# Patient Record
Sex: Female | Born: 1965 | Hispanic: No | Marital: Married | Smoking: Former smoker
Health system: Southern US, Community
[De-identification: ages and names within clinical notes are randomized; demographics above are authoritative.]

## PROBLEM LIST (undated history)

## (undated) DIAGNOSIS — I10 Essential (primary) hypertension: Secondary | ICD-10-CM

## (undated) DIAGNOSIS — F32A Depression, unspecified: Secondary | ICD-10-CM

## (undated) DIAGNOSIS — F419 Anxiety disorder, unspecified: Secondary | ICD-10-CM

## (undated) DIAGNOSIS — E119 Type 2 diabetes mellitus without complications: Secondary | ICD-10-CM

## (undated) HISTORY — DX: Type 2 diabetes mellitus without complications: E11.9

## (undated) HISTORY — DX: Anxiety disorder, unspecified: F41.9

## (undated) HISTORY — DX: Depression, unspecified: F32.A

## (undated) HISTORY — PX: TUBAL LIGATION: SHX77

---

## 2000-12-28 ENCOUNTER — Other Ambulatory Visit: Admission: RE | Admit: 2000-12-28 | Discharge: 2000-12-28 | Payer: Self-pay | Admitting: *Deleted

## 2001-09-10 ENCOUNTER — Emergency Department (HOSPITAL_COMMUNITY): Admission: EM | Admit: 2001-09-10 | Discharge: 2001-09-10 | Payer: Self-pay | Admitting: Emergency Medicine

## 2002-10-11 ENCOUNTER — Other Ambulatory Visit: Admission: RE | Admit: 2002-10-11 | Discharge: 2002-10-11 | Payer: Self-pay | Admitting: *Deleted

## 2003-07-25 ENCOUNTER — Emergency Department (HOSPITAL_COMMUNITY): Admission: EM | Admit: 2003-07-25 | Discharge: 2003-07-25 | Payer: Self-pay

## 2003-07-28 ENCOUNTER — Encounter: Payer: Self-pay | Admitting: *Deleted

## 2003-07-28 ENCOUNTER — Ambulatory Visit (HOSPITAL_COMMUNITY): Admission: RE | Admit: 2003-07-28 | Discharge: 2003-07-28 | Payer: Self-pay | Admitting: *Deleted

## 2003-12-14 ENCOUNTER — Encounter: Admission: RE | Admit: 2003-12-14 | Discharge: 2003-12-14 | Payer: Self-pay | Admitting: Gastroenterology

## 2004-10-30 ENCOUNTER — Other Ambulatory Visit: Admission: RE | Admit: 2004-10-30 | Discharge: 2004-10-30 | Payer: Self-pay | Admitting: Family Medicine

## 2005-08-20 ENCOUNTER — Encounter: Admission: RE | Admit: 2005-08-20 | Discharge: 2005-08-20 | Payer: Self-pay | Admitting: Family Medicine

## 2006-09-09 ENCOUNTER — Other Ambulatory Visit: Admission: RE | Admit: 2006-09-09 | Discharge: 2006-09-09 | Payer: Self-pay | Admitting: Family Medicine

## 2007-06-09 ENCOUNTER — Ambulatory Visit (HOSPITAL_COMMUNITY): Admission: RE | Admit: 2007-06-09 | Discharge: 2007-06-09 | Payer: Self-pay | Admitting: Family Medicine

## 2007-09-15 ENCOUNTER — Other Ambulatory Visit: Admission: RE | Admit: 2007-09-15 | Discharge: 2007-09-15 | Payer: Self-pay | Admitting: Family Medicine

## 2008-02-09 ENCOUNTER — Encounter: Admission: RE | Admit: 2008-02-09 | Discharge: 2008-02-09 | Payer: Self-pay | Admitting: Family Medicine

## 2008-02-17 ENCOUNTER — Encounter: Admission: RE | Admit: 2008-02-17 | Discharge: 2008-02-17 | Payer: Self-pay | Admitting: Family Medicine

## 2009-05-03 ENCOUNTER — Encounter: Admission: RE | Admit: 2009-05-03 | Discharge: 2009-05-03 | Payer: Self-pay | Admitting: Family Medicine

## 2009-10-24 ENCOUNTER — Other Ambulatory Visit: Admission: RE | Admit: 2009-10-24 | Discharge: 2009-10-24 | Payer: Self-pay | Admitting: Family Medicine

## 2010-10-23 ENCOUNTER — Encounter: Admission: RE | Admit: 2010-10-23 | Discharge: 2010-10-23 | Payer: Self-pay | Admitting: Family Medicine

## 2010-12-13 ENCOUNTER — Encounter
Admission: RE | Admit: 2010-12-13 | Discharge: 2010-12-13 | Payer: Self-pay | Source: Home / Self Care | Attending: Family Medicine | Admitting: Family Medicine

## 2011-01-05 ENCOUNTER — Encounter: Payer: Self-pay | Admitting: Family Medicine

## 2011-01-15 ENCOUNTER — Encounter: Payer: Self-pay | Admitting: Family Medicine

## 2011-06-25 IMAGING — US US ABDOMEN COMPLETE
1 series · 14 of 25 positions shown · non-contrast
Comparison: 06/09/2007

CLINICAL DATA: Nausea and abdominal pain.

COMPLETE ABDOMINAL ULTRASOUND

[Series 1: us abdomen complete · 0.35mm/px · 14 of 58 slices shown]
[im 1/58]
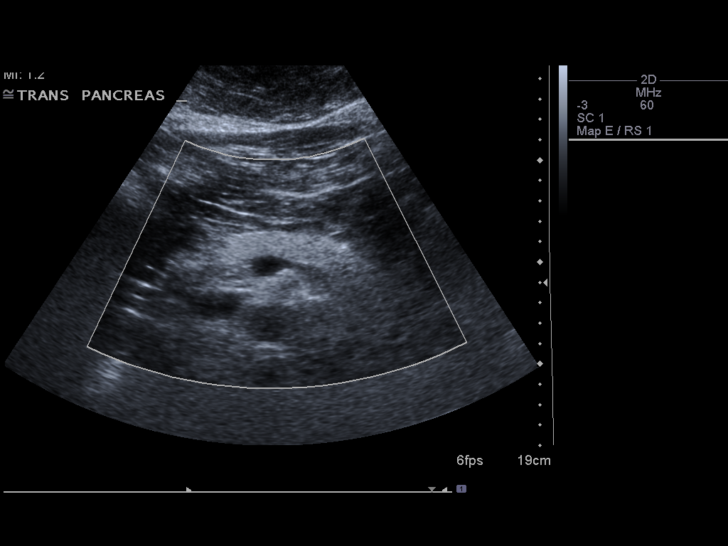
[im 5/58]
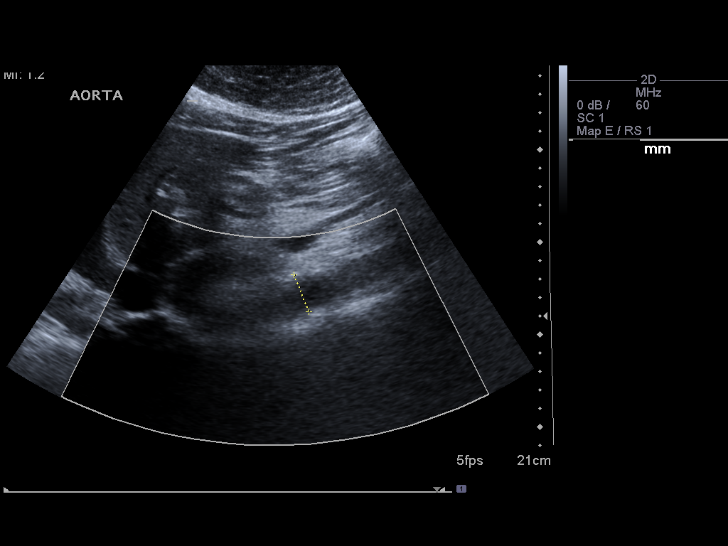
[im 10/58]
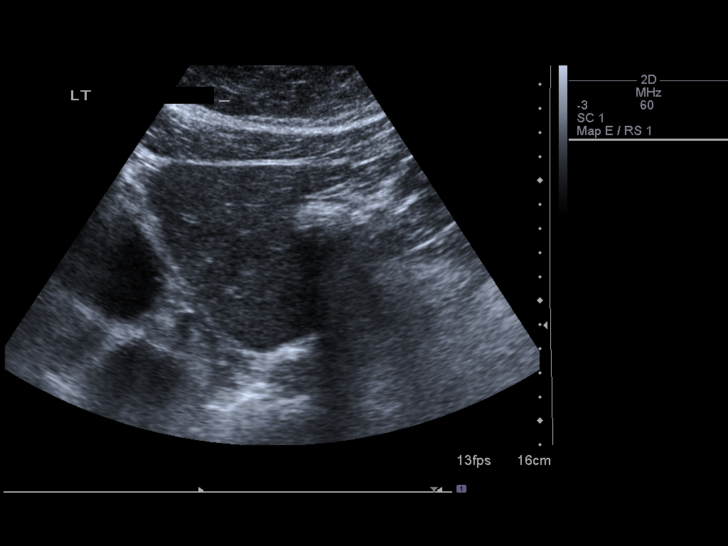
[im 15/58]
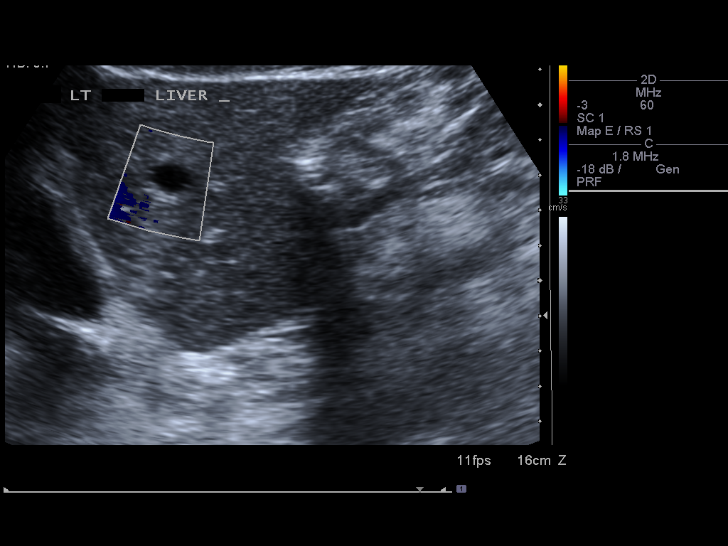
[im 20/58]
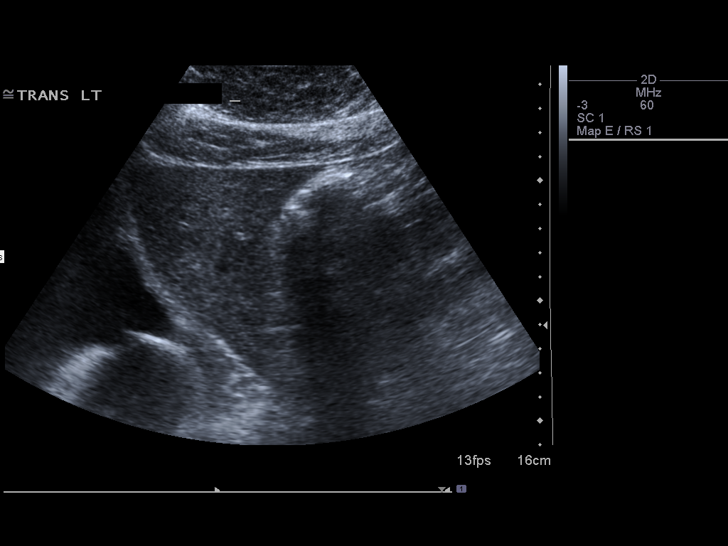
[im 22/58]
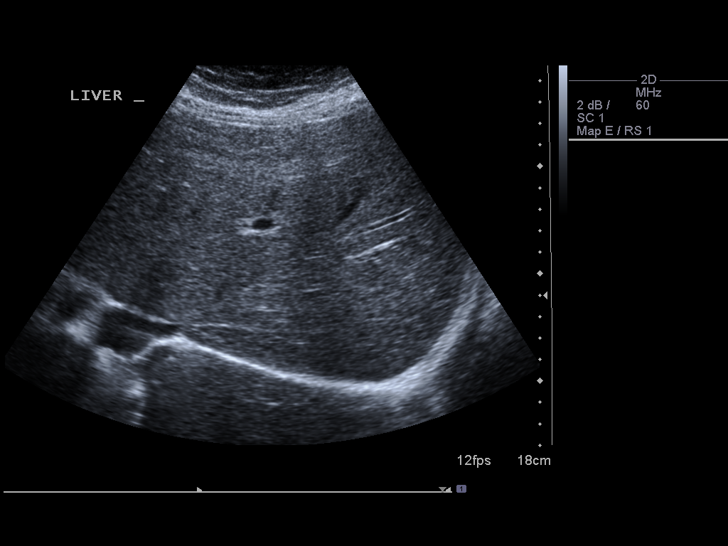
[im 27/58]
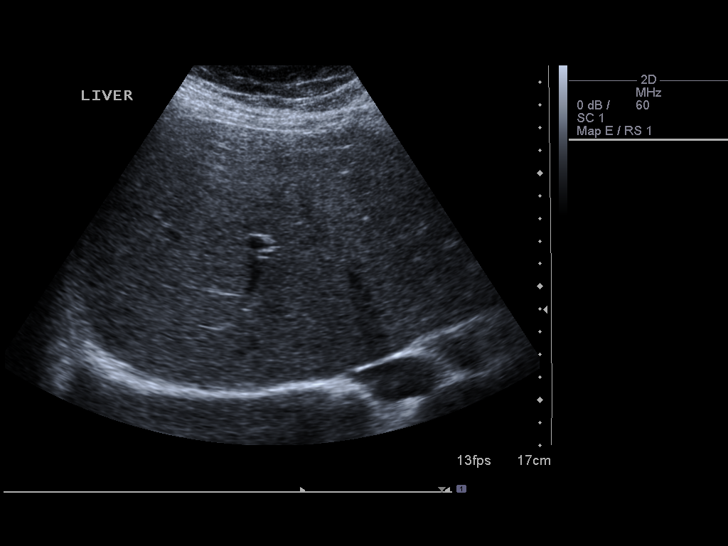
[im 31/58]
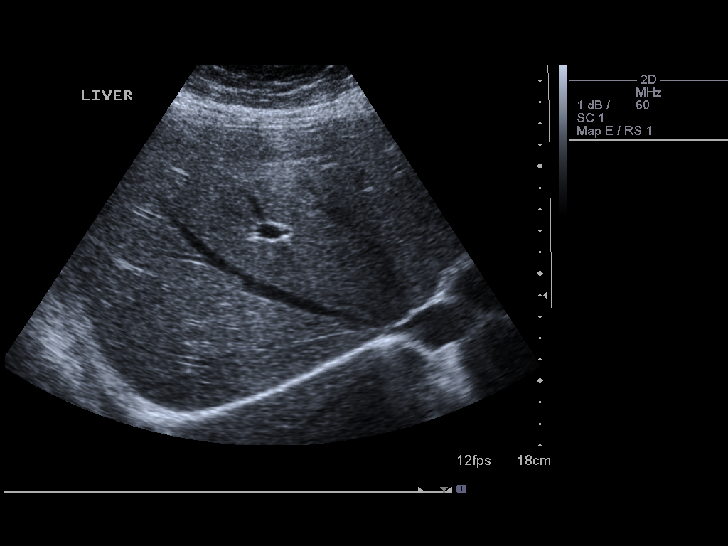
[im 36/58]
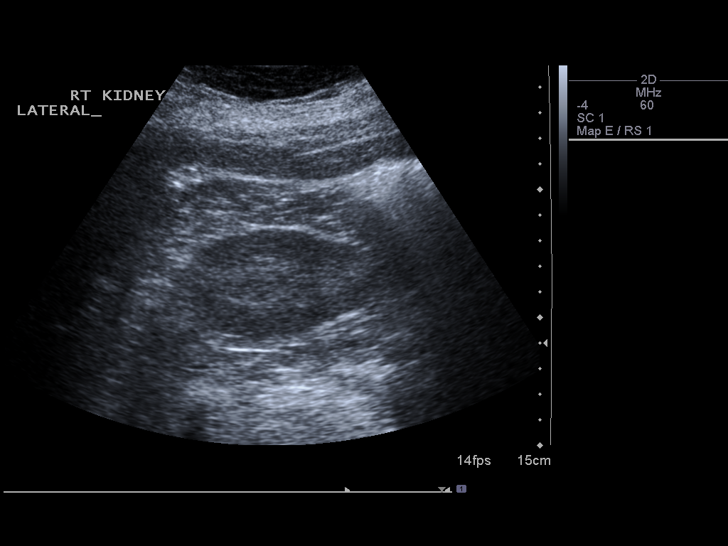
[im 39/58]
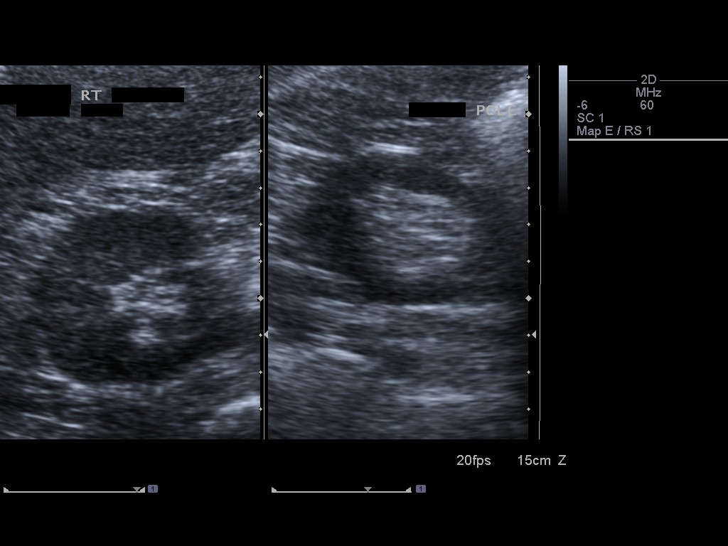
[im 43/58]
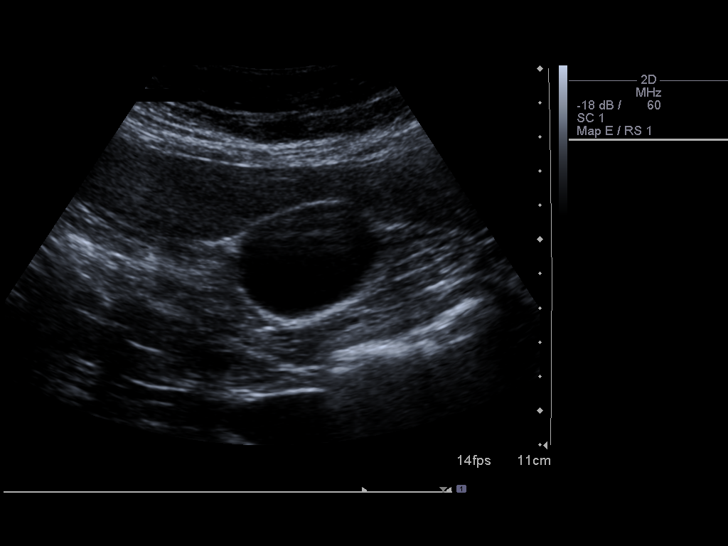
[im 48/58]
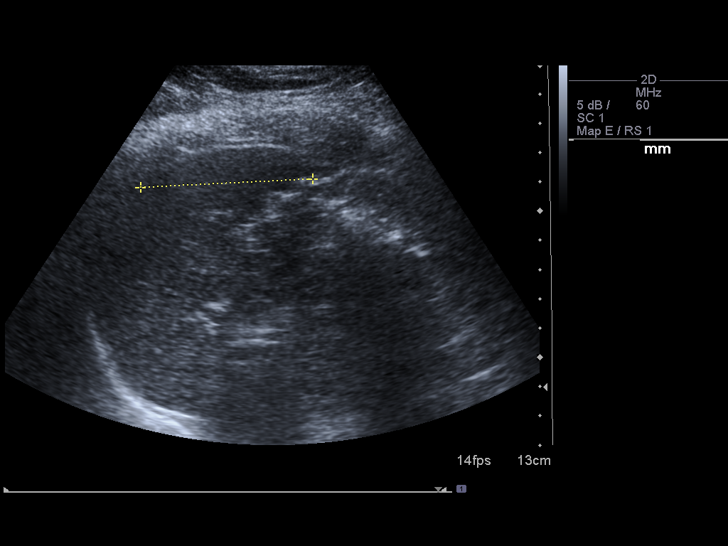
[im 53/58]
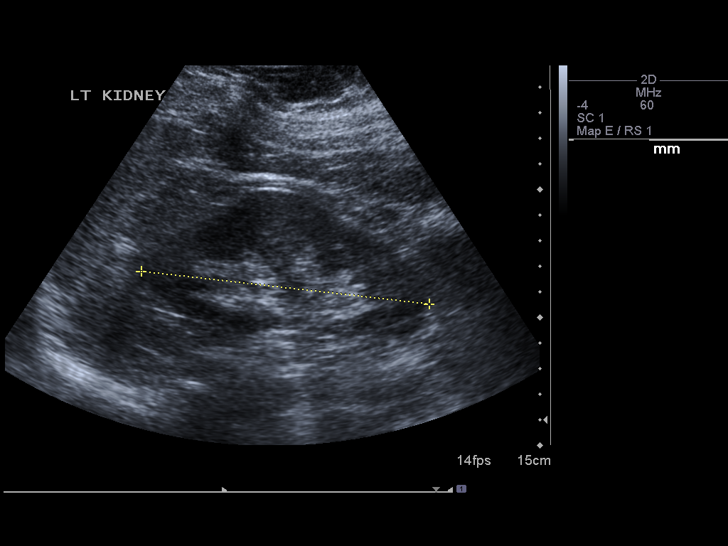
[im 58/58]
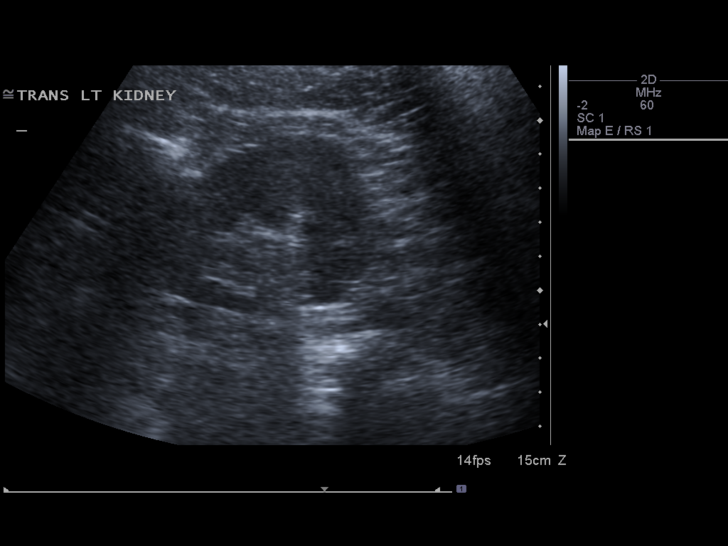

[14 of 25 positions shown; findings below may reference images not displayed]

FINDINGS: Gallbladder:  Negative.

Common bile duct:  Measures 3 mm, within normal limits.

Liver:  Contains a 1.2 x 0.8 x 1.4 cm cyst in the left hepatic
lobe.

IVC:  Visualized.

Pancreas:  Negative.

Spleen:  Measures 5.9 cm, negative.

Right Kidney:  Measures 12.9 cm, negative.

Left Kidney:  Measures 11.3 cm, negative.

Abdominal aorta:  No aneurysm identified.
IMPRESSION: No acute findings.

## 2016-09-25 ENCOUNTER — Telehealth: Payer: Self-pay | Admitting: Nurse Practitioner

## 2016-09-25 NOTE — Telephone Encounter (Signed)
Patient is establish care in November.  States previous provider will not prescribe any more medication.  Is requesting Baldo Ash to send lisinopril 20mg  (once a day) to [place] at [facility] until her appt.

## 2016-09-26 NOTE — Telephone Encounter (Signed)
Routing to [place], please advise, thanks 

## 2016-09-26 NOTE — Telephone Encounter (Signed)
No I will not be writing any prescription without any medical records. Thank you

## 2016-09-30 NOTE — Telephone Encounter (Signed)
Pt informed - she will contact previous PCP to have records sent to Lenon Curt., NP.

## 2016-10-01 ENCOUNTER — Ambulatory Visit (HOSPITAL_COMMUNITY)
Admission: EM | Admit: 2016-10-01 | Discharge: 2016-10-01 | Disposition: A | Discharge status: Home / Self Care | Payer: BLUE CROSS/BLUE SHIELD | Attending: Family Medicine | Admitting: Family Medicine

## 2016-10-01 ENCOUNTER — Encounter (HOSPITAL_COMMUNITY): Payer: Self-pay | Admitting: Family Medicine

## 2016-10-01 DIAGNOSIS — I10 Essential (primary) hypertension: Secondary | ICD-10-CM

## 2016-10-01 DIAGNOSIS — Z760 Encounter for issue of repeat prescription: Secondary | ICD-10-CM

## 2016-10-01 HISTORY — DX: Essential (primary) hypertension: I10

## 2016-10-01 LAB — POCT I-STAT, CHEM 8
BUN: <3 mg/dL — ABNORMAL LOW (ref 6–20)
Calcium, Ion: 1.19 mmol/L (ref 1.15–1.40)
Chloride: 104 mmol/L (ref 101–111)
Creatinine, Ser: 0.5 mg/dL (ref 0.44–1.00)
Glucose, Bld: 93 mg/dL (ref 65–99)
HCT: 38 % (ref 36.0–46.0)
Hemoglobin: 12.9 g/dL (ref 12.0–15.0)
Potassium: 3.7 mmol/L (ref 3.5–5.1)
Sodium: 140 mmol/L (ref 135–145)
TCO2: 25 mmol/L (ref 0–100)

## 2016-10-01 MED ORDER — LISINOPRIL 20 MG PO TABS: 20.0000 mg | ORAL_TABLET | Freq: Every day | ORAL | 0 refills | Status: DC

## 2016-10-01 NOTE — Discharge Instructions (Signed)
Take medicine daily and keep your doctor appt.as scheduled.

## 2016-10-01 NOTE — ED Triage Notes (Signed)
Pt here for refill on hypertension meds.

## 2016-10-01 NOTE — ED Provider Notes (Signed)
[name]    CSN: [id number] Arrival date & time: 10/01/16  1521     History   Chief Complaint Chief Complaint  Patient presents with  . Medication Refill    HPI Diane Sloan is a 50 y.o. female.   The history is provided by the patient.  Medication Refill  Medications/supplies requested:  Lisinopril 20mg . Reason for request:  Prescriptions expired Medications taken before: yes - see home medications   Patient has complete original prescription information: yes     Past Medical History:  Diagnosis Date  . Hypertension     There are no active problems to display for this patient.   History reviewed. No pertinent surgical history.  OB History    No data available       Home Medications    Prior to Admission medications   Medication Sig Start Date End Date Taking? Authorizing Provider  lisinopril (PRINIVIL,ZESTRIL) 20 MG tablet Take 20 mg by mouth daily.   Yes Historical Provider, MD    Family History History reviewed. No pertinent family history.  Social History Social History  Substance Use Topics  . Smoking status: Never Smoker  . Smokeless tobacco: Never Used  . Alcohol use Not on file     Allergies   Review of patient's allergies indicates no known allergies.   Review of Systems Review of Systems  Constitutional: Negative.   Respiratory: Negative for chest tightness and shortness of breath.   Cardiovascular: Negative for chest pain, palpitations and leg swelling.  Neurological: Negative for numbness and headaches.  All other systems reviewed and are negative.    Physical Exam Triage Vital Signs ED Triage Vitals [10/01/16 1538]  Enc Vitals Group     BP 136/88     Pulse Rate 93     Resp 18     Temp 98 F (36.7 C)     Temp src      SpO2 97 %     Weight      Height      Head Circumference      Peak Flow      Pain Score      Pain Loc      Pain Edu?      Excl. in [place]?    No data found.   Updated Vital  Signs BP 136/88   Pulse 93   Temp 98 F (36.7 C)   Resp 18   LMP 09/08/2016   SpO2 97%   Visual Acuity Right Eye Distance:   Left Eye Distance:   Bilateral Distance:    Right Eye Near:   Left Eye Near:    Bilateral Near:     Physical Exam  Constitutional: She is oriented to person, place, and time. She appears well-developed and well-nourished. No distress.  Eyes: Pupils are equal, round, and reactive to light.  Cardiovascular: Normal rate, regular rhythm, normal heart sounds and intact distal pulses.   Pulmonary/Chest: Effort normal and breath sounds normal.  Neurological: She is alert and oriented to person, place, and time.  Skin: Skin is warm and dry.  Nursing note and vitals reviewed.    UC Treatments / Results  Labs (all labs ordered are listed, but only abnormal results are displayed) Labs Reviewed - No data to display  I-stat wnl.  EKG  EKG Interpretation None       Radiology No results found.  Procedures Procedures (including critical care time)  Medications Ordered in UC Medications - No data  to display   Initial Impression / Assessment and Plan / UC Course  I have reviewed the triage vital signs and the nursing notes.  Pertinent labs & imaging results that were available during my care of the patient were reviewed by me and considered in my medical decision making (see chart for details).  Clinical Course      Final Clinical Impressions(s) / UC Diagnoses   Final diagnoses:  None    New Prescriptions New Prescriptions   No medications on file     Billy Fischer, MD 10/01/16 1610

## 2016-10-14 ENCOUNTER — Telehealth: Payer: Self-pay | Admitting: Nurse Practitioner

## 2016-10-14 NOTE — Telephone Encounter (Signed)
Rec'd from [place] [facility] forward 33 pages to Dr. Lorayne Marek

## 2016-10-20 NOTE — Telephone Encounter (Signed)
Notes are in system.  Patient would like to know if she can have refills?

## 2016-10-21 ENCOUNTER — Other Ambulatory Visit: Payer: Self-pay | Admitting: Nurse Practitioner

## 2016-10-21 DIAGNOSIS — I10 Essential (primary) hypertension: Secondary | ICD-10-CM | POA: Insufficient documentation

## 2016-10-21 DIAGNOSIS — F32A Depression, unspecified: Secondary | ICD-10-CM | POA: Insufficient documentation

## 2016-10-21 DIAGNOSIS — F329 Major depressive disorder, single episode, unspecified: Secondary | ICD-10-CM | POA: Insufficient documentation

## 2016-10-21 DIAGNOSIS — F419 Anxiety disorder, unspecified: Secondary | ICD-10-CM

## 2016-10-21 MED ORDER — LISINOPRIL 20 MG PO TABS: 20.0000 mg | ORAL_TABLET | Freq: Every day | ORAL | 0 refills | Status: DC

## 2016-10-21 NOTE — Telephone Encounter (Signed)
Sent 14tabs to last till  her upcoming appt 10/30/16.

## 2016-10-23 ENCOUNTER — Telehealth: Payer: Self-pay | Admitting: General Practice

## 2016-10-23 NOTE — Telephone Encounter (Signed)
Called into [facility]

## 2016-10-23 NOTE — Telephone Encounter (Signed)
Pt called in said that both of her meds that was suppose to be called in were not at the pharmacy.  Can these be resent?    She said that they were not handed to her at the appt

## 2016-10-30 ENCOUNTER — Encounter: Payer: Self-pay | Admitting: Nurse Practitioner

## 2016-10-30 ENCOUNTER — Ambulatory Visit (INDEPENDENT_AMBULATORY_CARE_PROVIDER_SITE_OTHER): Payer: BLUE CROSS/BLUE SHIELD | Admitting: Nurse Practitioner

## 2016-10-30 ENCOUNTER — Other Ambulatory Visit (INDEPENDENT_AMBULATORY_CARE_PROVIDER_SITE_OTHER): Payer: BLUE CROSS/BLUE SHIELD

## 2016-10-30 VITALS — BP 142/92 | HR 86 | Temp 97.6°F | Ht 63.0 in | Wt 269.0 lb

## 2016-10-30 DIAGNOSIS — R739 Hyperglycemia, unspecified: Secondary | ICD-10-CM | POA: Diagnosis not present

## 2016-10-30 DIAGNOSIS — Z0001 Encounter for general adult medical examination with abnormal findings: Secondary | ICD-10-CM | POA: Diagnosis not present

## 2016-10-30 DIAGNOSIS — E781 Pure hyperglyceridemia: Secondary | ICD-10-CM

## 2016-10-30 DIAGNOSIS — F329 Major depressive disorder, single episode, unspecified: Secondary | ICD-10-CM | POA: Diagnosis not present

## 2016-10-30 DIAGNOSIS — Z1211 Encounter for screening for malignant neoplasm of colon: Secondary | ICD-10-CM

## 2016-10-30 DIAGNOSIS — Z1231 Encounter for screening mammogram for malignant neoplasm of breast: Secondary | ICD-10-CM

## 2016-10-30 DIAGNOSIS — I10 Essential (primary) hypertension: Secondary | ICD-10-CM

## 2016-10-30 DIAGNOSIS — R229 Localized swelling, mass and lump, unspecified: Secondary | ICD-10-CM | POA: Diagnosis not present

## 2016-10-30 DIAGNOSIS — F32A Depression, unspecified: Secondary | ICD-10-CM

## 2016-10-30 LAB — LIPID PANEL
Cholesterol: 158 mg/dL (ref 0–200)
HDL: 55.1 mg/dL (ref >39.00)
NonHDL: 102.85
Total CHOL/HDL Ratio: 3
Triglycerides: 208 mg/dL — ABNORMAL HIGH (ref 0.0–149.0)
VLDL: 41.6 mg/dL — ABNORMAL HIGH (ref 0.0–40.0)

## 2016-10-30 LAB — COMPREHENSIVE METABOLIC PANEL
ALT: 18 U/L (ref 0–35)
AST: 14 U/L (ref 0–37)
Albumin: 4.1 g/dL (ref 3.5–5.2)
Alkaline Phosphatase: 68 U/L (ref 39–117)
BUN: 8 mg/dL (ref 6–23)
CO2: 25 mEq/L (ref 19–32)
Calcium: 9.5 mg/dL (ref 8.4–10.5)
Chloride: 102 mEq/L (ref 96–112)
Creatinine, Ser: 0.72 mg/dL (ref 0.40–1.20)
GFR: 91.15 mL/min (ref >60.00)
Glucose, Bld: 117 mg/dL — ABNORMAL HIGH (ref 70–99)
Potassium: 3.8 mEq/L (ref 3.5–5.1)
Sodium: 136 mEq/L (ref 135–145)
Total Bilirubin: 0.2 mg/dL (ref 0.2–1.2)
Total Protein: 7.7 g/dL (ref 6.0–8.3)

## 2016-10-30 LAB — CBC WITH DIFFERENTIAL/PLATELET
Basophils Absolute: 0 10*3/uL (ref 0.0–0.1)
Basophils Relative: 0.3 % (ref 0.0–3.0)
Eosinophils Absolute: 0.1 10*3/uL (ref 0.0–0.7)
Eosinophils Relative: 1.1 % (ref 0.0–5.0)
HCT: 36 % (ref 36.0–46.0)
Hemoglobin: 11.7 g/dL — ABNORMAL LOW (ref 12.0–15.0)
Lymphocytes Relative: 16.2 % (ref 12.0–46.0)
Lymphs Abs: 2.1 10*3/uL (ref 0.7–4.0)
MCHC: 32.6 g/dL (ref 30.0–36.0)
MCV: 85.3 fl (ref 78.0–100.0)
Monocytes Absolute: 0.5 10*3/uL (ref 0.1–1.0)
Monocytes Relative: 3.8 % (ref 3.0–12.0)
Neutro Abs: 10.2 10*3/uL — ABNORMAL HIGH (ref 1.4–7.7)
Neutrophils Relative %: 78.6 % — ABNORMAL HIGH (ref 43.0–77.0)
Platelets: 607 10*3/uL — ABNORMAL HIGH (ref 150.0–400.0)
RBC: 4.22 Mil/uL (ref 3.87–5.11)
RDW: 15.1 % (ref 11.5–15.5)
WBC: 13 10*3/uL — ABNORMAL HIGH (ref 4.0–10.5)

## 2016-10-30 LAB — HEMOGLOBIN A1C: Hgb A1c MFr Bld: 5.6 % (ref 4.6–6.5)

## 2016-10-30 LAB — TSH: TSH: 0.79 u[IU]/mL (ref 0.35–4.50)

## 2016-10-30 LAB — LDL CHOLESTEROL, DIRECT: LDL DIRECT: 86 mg/dL

## 2016-10-30 MED ORDER — PAROXETINE HCL 40 MG PO TABS: 40.0000 mg | ORAL_TABLET | ORAL | 1 refills | Status: DC

## 2016-10-30 MED ORDER — LISINOPRIL 20 MG PO TABS: 20.0000 mg | ORAL_TABLET | Freq: Every day | ORAL | 1 refills | Status: DC

## 2016-10-30 NOTE — Progress Notes (Signed)
Subjective:    Patient ID: Diane Sloan, female    DOB: [birth year], 50 y.o.   MRN: [id number]  Patient presents today for complete physical or establish care (new patient) and med refill  Hypertension  This is a chronic problem. The current episode started more than 1 year ago. The problem is unchanged. The problem is controlled. Associated symptoms include anxiety. Pertinent negatives include no chest pain, headaches, malaise/fatigue, orthopnea, palpitations, peripheral edema, PND or shortness of breath. There are no associated agents to hypertension. Risk factors for coronary artery disease include family history and obesity. Past treatments include ACE inhibitors. The current treatment provides significant improvement. Compliance problems include medication cost.  There is no history of angina, CAD/MI, CVA, heart failure, left ventricular hypertrophy or a thyroid problem. There is no history of a hypertension causing med.  Rash  This is a chronic problem. The current episode started more than 1 year ago. The problem has been gradually worsening since onset. The affected locations include the torso, left arm and right arm. The rash is characterized by pain. She was exposed to nothing. Pertinent negatives include no anorexia, congestion, cough, diarrhea, fatigue, fever, joint pain, nail changes, rhinorrhea, shortness of breath or sore throat. Past treatments include nothing.    last seen by previous pcp with [facility] 2014.  Immunizations: (TDAP, Hep C screen, Pneumovax, Influenza, zoster)  Health Maintenance  Topic Date Due  . Flu Shot  03/14/2017*  . Pap Smear  10/15/2017*  . Tetanus Vaccine  10/15/2017*  . HIV Screening  10/15/2017*  *Topic was postponed. The date shown is not the original due date.   Diet:none Weight:  Wt Readings from Last 3 Encounters:  10/30/16 269 lb (122 kg)   Exercise:none Fall Risk: Fall Risk  10/30/2016  Falls in the past year? No    Depression/Suicide: Depression screen PHQ 2/9 10/30/2016  Decreased Interest 0  Down, Depressed, Hopeless 0  PHQ - 2 Score 0   No flowsheet data found. Colonoscopy (every 5-22yrs, >50-57yrs):needed Pap Smear (every 79yrs for >21-29 without HPV, every 55yrs for >30-74yrs with HPV):needed, last done 2013, normal per patient, no hx of abnormal PAP, does not want PAP done today. Mammogram (yearly, >82yrs):last done 2012, normal  Per patient. Vision:needed Dental:needed Advanced Directive: Advanced Directives 10/30/2016  Does patient have an advance directive? No  Would patient like information on creating an advanced directive? No - patient declined information   Sexual History (birth control, marital status, STD):not sexually active  Medications and allergies reviewed with patient and updated if appropriate.  Patient Active Problem List   Diagnosis Date Noted  . Essential hypertension, benign 10/21/2016  . Depression 10/21/2016    No current outpatient prescriptions on file prior to visit.   No current facility-administered medications on file prior to visit.     Past Medical History:  Diagnosis Date  . Anxiety   . Hypertension     Past Surgical History:  Procedure Laterality Date  . TUBAL LIGATION      Social History   Social History  . Marital status: Married    Spouse name: N/A  . Number of children: N/A  . Years of education: N/A   Social History Main Topics  . Smoking status: Never Smoker  . Smokeless tobacco: Never Used  . Alcohol use No  . Drug use: No  . Sexual activity: Yes    Birth control/ protection: Surgical   Other Topics Concern  . None   Social History  Narrative  . None    Family History  Problem Relation Age of Onset  . Hypertension Mother   . Stroke Mother 36    hemorrhagic  . Diabetes Maternal Grandmother         Review of Systems  Constitutional: Negative for fatigue, fever, malaise/fatigue and weight loss.  HENT:  Negative for congestion, rhinorrhea and sore throat.   Eyes:       Negative for visual changes  Respiratory: Negative for cough and shortness of breath.   Cardiovascular: Negative for chest pain, palpitations, orthopnea, leg swelling and PND.  Gastrointestinal: Negative for anorexia, blood in stool, constipation, diarrhea and heartburn.  Genitourinary: Negative for dysuria, frequency and urgency.  Musculoskeletal: Negative for falls, joint pain and myalgias.  Skin: Negative for nail changes and rash.  Neurological: Negative for dizziness, sensory change and headaches.  Endo/Heme/Allergies: Does not bruise/bleed easily.  Psychiatric/Behavioral: Positive for depression. Negative for substance abuse and suicidal ideas. The patient is nervous/anxious.     Objective:   Vitals:   10/30/16 1417  BP: (!) 142/92  Pulse: 86  Temp: 97.6 F (36.4 C)    Body mass index is 47.65 kg/m.   Physical Examination:  Physical Exam  Constitutional: She is oriented to person, place, and time and well-developed, well-nourished, and in no distress. No distress.  HENT:  Right Ear: External ear normal.  Left Ear: External ear normal.  Nose: Nose normal.  Mouth/Throat: No oropharyngeal exudate.  Eyes: Conjunctivae and EOM are normal. Pupils are equal, round, and reactive to light. No scleral icterus.  Neck: Normal range of motion. Neck supple. No thyromegaly present.  Cardiovascular: Normal rate, regular rhythm, normal heart sounds and intact distal pulses.   Pulmonary/Chest: Effort normal and breath sounds normal. Right breast exhibits no inverted nipple, no mass, no nipple discharge, no skin change and no tenderness. Left breast exhibits no inverted nipple, no mass, no nipple discharge, no skin change and no tenderness. Breasts are symmetrical.  Abdominal: Soft. Bowel sounds are normal. She exhibits no distension. There is no tenderness.  Genitourinary: Vulva normal.  Genitourinary Comments: Declined  by patient  Musculoskeletal: Normal range of motion. She exhibits no edema or tenderness.  Lymphadenopathy:    She has no cervical adenopathy.  Neurological: She is alert and oriented to person, place, and time. Gait normal.  Skin: Skin is warm, dry and intact. Rash noted. Rash is nodular. No erythema.     Psychiatric: Affect and judgment normal.  Vitals reviewed.   ASSESSMENT and PLAN:  Diane Sloan was seen today for hypertension.  Diagnoses and all orders for this visit:  Encounter for preventative adult health care exam with abnormal findings -     Discontinue: lisinopril (PRINIVIL,ZESTRIL) 20 MG tablet; Take 1 tablet (20 mg total) by mouth daily. For blood pressure. -     PARoxetine (PAXIL) 40 MG tablet; Take 1 tablet (40 mg total) by mouth every morning. -     Comprehensive metabolic panel; Future -     CBC w/Diff; Future -     TSH; Future -     Lipid panel; Future -     Hemoglobin A1c; Future -     lisinopril (PRINIVIL,ZESTRIL) 20 MG tablet; Take 1 tablet (20 mg total) by mouth daily. For blood pressure.  Depression, unspecified depression type -     PARoxetine (PAXIL) 40 MG tablet; Take 1 tablet (40 mg total) by mouth every morning.  Essential hypertension, benign -     Discontinue: lisinopril (  PRINIVIL,ZESTRIL) 20 MG tablet; Take 1 tablet (20 mg total) by mouth daily. For blood pressure. -     Comprehensive metabolic panel; Future -     lisinopril (PRINIVIL,ZESTRIL) 20 MG tablet; Take 1 tablet (20 mg total) by mouth daily. For blood pressure.  Multiple skin nodules -     Ambulatory referral to Dermatology  Hyperglycemia -     Comprehensive metabolic panel; Future -     Hemoglobin A1c; Future  Screening for colon cancer -     Ambulatory referral to Gastroenterology  Encounter for screening mammogram for breast cancer -     MM DIGITAL SCREENING BILATERAL; Future   No problem-specific Assessment & Plan notes found for this encounter.     Follow up: Return in  about 6 months (around 04/29/2017) for anxiety and HTN.  Wilfred Lacy, NP

## 2016-10-30 NOTE — Progress Notes (Signed)
Pre visit review using our clinic review tool, if applicable. No additional management support is needed unless otherwise documented below in the visit note. 

## 2016-10-31 DIAGNOSIS — R739 Hyperglycemia, unspecified: Secondary | ICD-10-CM | POA: Insufficient documentation

## 2016-10-31 DIAGNOSIS — E781 Pure hyperglyceridemia: Secondary | ICD-10-CM | POA: Insufficient documentation

## 2016-10-31 MED ORDER — OMEGA 3 1000 MG PO CAPS: 2.0000 | ORAL_CAPSULE | Freq: Two times a day (BID) | ORAL | 3 refills | Status: DC

## 2016-11-05 ENCOUNTER — Telehealth: Payer: Self-pay

## 2016-11-05 NOTE — Telephone Encounter (Signed)
PA initiated via [place] [phone] and APPROVED 11/05/2016 - 11/05/2017 Ref # [id number]. Pharmacy advised via fax

## 2016-11-17 ENCOUNTER — Telehealth: Payer: Self-pay

## 2016-11-17 NOTE — Telephone Encounter (Signed)
Pt is rq rf for lorazepam. Not on pt med list.   Please advise.

## 2016-11-17 NOTE — Telephone Encounter (Signed)
Pt informed of PCP response. Pt stated understanding and an appt has been made.

## 2016-11-17 NOTE — Telephone Encounter (Signed)
No I will not be prescribing that. She can make appt to discuss other medications for anxiety. Thank you

## 2016-11-20 ENCOUNTER — Ambulatory Visit: Payer: BLUE CROSS/BLUE SHIELD | Admitting: Nurse Practitioner

## 2016-12-12 ENCOUNTER — Ambulatory Visit: Payer: BLUE CROSS/BLUE SHIELD

## 2016-12-16 ENCOUNTER — Encounter: Payer: Self-pay | Admitting: Nurse Practitioner

## 2017-01-26 ENCOUNTER — Other Ambulatory Visit: Payer: Self-pay | Admitting: *Deleted

## 2017-01-26 DIAGNOSIS — Z0001 Encounter for general adult medical examination with abnormal findings: Secondary | ICD-10-CM

## 2017-01-26 DIAGNOSIS — I10 Essential (primary) hypertension: Secondary | ICD-10-CM

## 2017-01-26 MED ORDER — LISINOPRIL 20 MG PO TABS: 20.0000 mg | ORAL_TABLET | Freq: Every day | ORAL | 1 refills | Status: DC

## 2017-02-05 ENCOUNTER — Other Ambulatory Visit: Payer: Self-pay | Admitting: Nurse Practitioner

## 2017-02-05 DIAGNOSIS — Z1231 Encounter for screening mammogram for malignant neoplasm of breast: Secondary | ICD-10-CM

## 2017-02-12 ENCOUNTER — Ambulatory Visit: Payer: BLUE CROSS/BLUE SHIELD

## 2017-04-29 ENCOUNTER — Ambulatory Visit: Payer: BLUE CROSS/BLUE SHIELD | Admitting: Nurse Practitioner

## 2017-04-30 ENCOUNTER — Telehealth: Payer: Self-pay | Admitting: Nurse Practitioner

## 2017-04-30 NOTE — Telephone Encounter (Signed)
Pt no-showed for her appointment that was scheduled on 04/29/2017 at 8:00am for a 6 month follow up. How would you like to proceed?

## 2017-04-30 NOTE — Telephone Encounter (Signed)
This patient had 2 no show appts. Why am I being asked how to proceed?

## 2017-05-01 NOTE — Telephone Encounter (Signed)
Left message for pt to call back to reschedule.

## 2018-03-23 ENCOUNTER — Encounter: Payer: Self-pay | Admitting: Family Medicine

## 2018-03-23 ENCOUNTER — Ambulatory Visit (INDEPENDENT_AMBULATORY_CARE_PROVIDER_SITE_OTHER): Payer: Self-pay | Admitting: Family Medicine

## 2018-03-23 DIAGNOSIS — Z0001 Encounter for general adult medical examination with abnormal findings: Secondary | ICD-10-CM

## 2018-03-23 DIAGNOSIS — F419 Anxiety disorder, unspecified: Secondary | ICD-10-CM

## 2018-03-23 DIAGNOSIS — F32A Depression, unspecified: Secondary | ICD-10-CM

## 2018-03-23 DIAGNOSIS — F329 Major depressive disorder, single episode, unspecified: Secondary | ICD-10-CM

## 2018-03-23 DIAGNOSIS — I10 Essential (primary) hypertension: Secondary | ICD-10-CM

## 2018-03-23 MED ORDER — LISINOPRIL 20 MG PO TABS: 20.0000 mg | ORAL_TABLET | Freq: Every day | ORAL | 1 refills | Status: DC

## 2018-03-23 MED ORDER — PAROXETINE HCL 40 MG PO TABS: 20.0000 mg | ORAL_TABLET | ORAL | 1 refills | Status: DC

## 2018-03-23 NOTE — Patient Instructions (Signed)
Please try the anxiety medication. Please follow up in two weeks with me or your primary doctor   Diet Recommendations  Starchy (carb) foods include: Bread, rice, pasta, potatoes, corn, crackers, bagels, muffins, all baked goods.   Protein foods include: Meat, fish, poultry, eggs, dairy foods, and beans such as pinto and kidney beans (beans also provide carbohydrate).   1. Eat at least 3 meals and 1-2 snacks per day. Never go more than 4-5 hours while awake without eating.  2. Limit starchy foods to TWO per meal and ONE per snack. ONE portion of a starchy  food is equal to the following:   - ONE slice of bread (or its equivalent, such as half of a hamburger bun).   - 1/2 cup of a "scoopable" starchy food such as potatoes or rice.   - 1 OUNCE (28 grams) of starchy snack foods such as crackers or pretzels (look on label).   - 15 grams of carbohydrate as shown on food label.  3. Both lunch and dinner should include a protein food, a carb food, and vegetables.   - Obtain twice as many veg's as protein or carbohydrate foods for both lunch and dinner.   - Try to keep frozen veg's on hand for a quick vegetable serving.     - Fresh or frozen veg's are best.  4. Breakfast should always include protein.

## 2018-03-23 NOTE — Progress Notes (Signed)
Diane Sloan - 52 y.o. female MRN [id number]  Date of birth: 08/24/1966  SUBJECTIVE:  Including CC & ROS.  Chief Complaint  Patient presents with  . Medication Refill    Refill on blood pressure medication; Request to also have something for anxiety    Diane Sloan is a 52 y.o. female that is presenting with hypertension and anxiety. She has not been seen in a few years for these conditions.  HTN:  Previously she was placed on lisinopril and denies any complications. Denies any shortness of breath or chest pain.Has not had lab work done in a couple of years.  Anxiety:previously she was placed on Paxil. She reports starting a new job in [place]. She has anxiety involdriving long distances. She also has problems with the areas are under construction. She feels fearful when she is driving.  She also contemplates not going to work due to the anxiety associated with the driving.reports to hang anxiety intermittently for years now. She has been able to cope individually but now is no longer to do that without the medication she feels.   Review of Systems  Constitutional: Negative for fever.  HENT: Negative for congestion.   Respiratory: Negative for cough.   Cardiovascular: Negative for chest pain.  Gastrointestinal: Negative for abdominal pain.  Musculoskeletal: Negative for back pain.  Skin: Negative for color change.  Hematological: Negative for adenopathy.  Psychiatric/Behavioral: Negative for agitation.    HISTORY: Past Medical, Surgical, Social, and Family History Reviewed & Updated per EMR.   Pertinent Historical Findings include:  Past Medical History:  Diagnosis Date  . Anxiety   . Hypertension     Past Surgical History:  Procedure Laterality Date  . TUBAL LIGATION      No Known Allergies  Family History  Problem Relation Age of Onset  . Hypertension Mother   . Stroke Mother 26       hemorrhagic  . Diabetes Maternal Grandmother      Social  History   Socioeconomic History  . Marital status: Married    Spouse name: Not on file  . Number of children: Not on file  . Years of education: Not on file  . Highest education level: Not on file  Occupational History  . Not on file  Social Needs  . Financial resource strain: Not on file  . Food insecurity:    Worry: Not on file    Inability: Not on file  . Transportation needs:    Medical: Not on file    Non-medical: Not on file  Tobacco Use  . Smoking status: Never Smoker  . Smokeless tobacco: Never Used  Substance and Sexual Activity  . Alcohol use: No  . Drug use: No  . Sexual activity: Yes    Birth control/protection: Surgical  Lifestyle  . Physical activity:    Days per week: Not on file    Minutes per session: Not on file  . Stress: Not on file  Relationships  . Social connections:    Talks on phone: Not on file    Gets together: Not on file    Attends religious service: Not on file    Active member of club or organization: Not on file    Attends meetings of clubs or organizations: Not on file    Relationship status: Not on file  . Intimate partner violence:    Fear of current or ex partner: Not on file    Emotionally abused: Not on file  Physically abused: Not on file    Forced sexual activity: Not on file  Other Topics Concern  . Not on file  Social History Narrative  . Not on file     PHYSICAL EXAM:  VS: BP (!) 150/90 (BP Location: Left Arm, Patient Position: Sitting, Cuff Size: Large)   Pulse (!) 106   Temp 98.4 F (36.9 C) (Oral)   Wt 249 lb 1.3 oz (113 kg)   SpO2 97%   BMI 44.12 kg/m  Physical Exam Gen: NAD, alert, cooperative with exam, well-appearing ENT: normal lips, normal nasal mucosa,  Eye: normal EOM, normal conjunctiva and lids CV:  no edema, +2 pedal pulses, S1S2, tachycardia    Resp: no accessory muscle use, non-labored, CTAL  Skin: no rashes, no areas of induration  Neuro: normal tone, normal sensation to touch Psych:   normal insight, alert and oriented, no SI or HI MSK: normal gait, normal strength       ASSESSMENT & PLAN:   Essential hypertension, benign Uncontrolled. We will restart her medicine appropriately advised she is to follow-up in order to have lab work done and any dose changes - lisinopril   Anxiety and depression Reports having symptoms associated with driving and is now starting a new job in [employer] - restarted paxil  - advised to f/u. May need to increase dose.

## 2018-03-24 NOTE — Assessment & Plan Note (Signed)
Uncontrolled. We will restart her medicine appropriately advised she is to follow-up in order to have lab work done and any dose changes - lisinopril

## 2018-03-24 NOTE — Assessment & Plan Note (Signed)
Reports having symptoms associated with driving and is now starting a new job in [facility] [employer] - restarted paxil  - advised to f/u. May need to increase dose.

## 2018-10-11 ENCOUNTER — Other Ambulatory Visit: Payer: Self-pay | Admitting: Family Medicine

## 2018-10-11 DIAGNOSIS — I10 Essential (primary) hypertension: Secondary | ICD-10-CM

## 2018-10-11 DIAGNOSIS — Z0001 Encounter for general adult medical examination with abnormal findings: Secondary | ICD-10-CM

## 2018-12-29 ENCOUNTER — Ambulatory Visit: Payer: Self-pay | Admitting: Nurse Practitioner

## 2018-12-29 DIAGNOSIS — Z0289 Encounter for other administrative examinations: Secondary | ICD-10-CM

## 2018-12-31 ENCOUNTER — Encounter: Payer: Self-pay | Admitting: Nurse Practitioner

## 2019-02-14 ENCOUNTER — Encounter: Payer: Self-pay | Admitting: Nurse Practitioner

## 2019-02-14 ENCOUNTER — Emergency Department (HOSPITAL_COMMUNITY)
Admission: EM | Admit: 2019-02-14 | Discharge: 2019-02-14 | Disposition: A | Discharge status: Other Acute Inpatient Hospital | Payer: Self-pay

## 2019-02-14 ENCOUNTER — Ambulatory Visit (INDEPENDENT_AMBULATORY_CARE_PROVIDER_SITE_OTHER): Payer: PRIVATE HEALTH INSURANCE | Admitting: Nurse Practitioner

## 2019-02-14 ENCOUNTER — Ambulatory Visit: Payer: Self-pay | Admitting: *Deleted

## 2019-02-14 VITALS — BP 138/92 | HR 58 | Temp 98.2°F | Resp 20 | Ht 64.0 in | Wt 273.6 lb

## 2019-02-14 DIAGNOSIS — T7840XA Allergy, unspecified, initial encounter: Secondary | ICD-10-CM | POA: Diagnosis not present

## 2019-02-14 DIAGNOSIS — L509 Urticaria, unspecified: Secondary | ICD-10-CM | POA: Diagnosis not present

## 2019-02-14 MED ORDER — DIPHENHYDRAMINE HCL 25 MG PO TABS: 25.0000 mg | ORAL_TABLET | Freq: Three times a day (TID) | ORAL | 0 refills | Status: AC | PRN

## 2019-02-14 MED ORDER — METHYLPREDNISOLONE ACETATE 40 MG/ML IJ SUSP
40.0000 mg | Freq: Once | INTRAMUSCULAR | Status: AC
  Administered 2019-02-14: 40 mg via INTRAMUSCULAR

## 2019-02-14 MED ORDER — PREDNISONE 10 MG (21) PO TBPK: ORAL_TABLET | ORAL | 0 refills | Status: DC

## 2019-02-14 MED ORDER — EPINEPHRINE 0.3 MG/0.3ML IJ SOAJ: 0.3000 mg | INTRAMUSCULAR | 0 refills | Status: AC | PRN

## 2019-02-14 MED ORDER — FAMOTIDINE 20 MG PO TABS: 20.0000 mg | ORAL_TABLET | Freq: Two times a day (BID) | ORAL | 0 refills | Status: DC

## 2019-02-14 NOTE — Patient Instructions (Signed)
Go to hospital if symptoms worsen.  Hives Hives are itchy, red, swollen areas on your skin. Hives can show up on any part of your body. Hives often fade within 24 hours (acute hives). New hives can show up after old ones fade. This can go on for many days or weeks (chronic hives). Hives do not spread from person to person (are not contagious). Hives are caused by your body's response to something that you are allergic to (allergen). These are sometimes called triggers. You can get hives right after being around a trigger, or hours later. What are the causes?  Allergies to foods.  Insect bites or stings.  Pollen.  Pets.  Latex.  Chemicals.  Spending time in sunlight, heat, or cold.  Exercise.  Stress.  Some medicines.  Viruses. This includes the common cold.  Infections caused by germs (bacteria).  Allergy shots.  Blood transfusions. Sometimes, the cause is not known. What increases the risk?  Being a woman.  Being allergic to foods such as: ? Citrus fruits. ? Milk. ? Eggs. ? Peanuts. ? Tree nuts. ? Shellfish.  Being allergic to: ? Medicines. ? Latex. ? Insects. ? Animals. ? Pollen. What are the signs or symptoms?   Raised, itchy, red or white bumps or patches on your skin. These areas may: ? Get large and swollen. ? Change in shape and location. ? Stand alone or connect to each other over a large area of skin. ? Sting or hurt. ? Turn white when pressed in the center (blanch). In very bad cases, your hands, feet, and face may also get swollen. This may happen if hives start deeper in your skin. How is this treated? Treatment for this condition depends on your symptoms. Treatment may include:  Using cool, wet cloths (cool compresses) or taking cool showers to stop the itching.  Medicines that help: ? Relieve itching (antihistamines). ? Reduce swelling (corticosteroids). ? Treat infection (antibiotics).  A medicine (omalizumab) that is given as a  shot (injection). Your doctor may prescribe this if you have hives that do not get better even after other treatments.  In very bad cases, you may need a shot of a medicine called epinephrineto prevent a life-threatening allergic reaction (anaphylaxis). Follow these instructions at home: Medicines  Take or apply over-the-counter and prescription medicines only as told by your doctor.  If you were prescribed an antibiotic medicine, use it as told by your doctor. Do not stop using it even if you start to feel better. Skin care  Apply cool, wet cloths to the hives.  Do not scratch your skin. Do not rub your skin. General instructions  Do not take hot showers or baths. This can make itching worse.  Do not wear tight clothes.  Use sunscreen and wear clothes that cover your skin when you are outside.  Avoid any triggers that cause your hives. Keep a journal to help track what causes your hives. Write down: ? What medicines you take. ? What you eat and drink. ? What products you use on your skin.  Keep all follow-up visits as told by your doctor. This is important. Contact a doctor if:  Your symptoms are not better with medicine.  Your joints hurt or are swollen. Get help right away if:  You have a fever.  You have pain in your belly (abdomen).  Your tongue or lips are swollen.  Your eyelids are swollen.  Your chest or throat feels tight.  You have trouble breathing or  swallowing. These symptoms may be an emergency. Do not wait to see if the symptoms will go away. Get medical help right away. Call your local emergency services (911 in the [place] Do not drive yourself to the hospital. Summary  Hives are itchy, red, swollen areas on your skin.  Treatment for this condition depends on your symptoms.  Avoid things that cause your hives. Keep a journal to help track what causes your hives.  Take and apply over-the-counter and prescription medicines only as told by your  doctor.  Keep all follow-up visits as told by your doctor. This is important. This information is not intended to replace advice given to you by your health care provider. Make sure you discuss any questions you have with your health care provider. Document Released: 09/09/2008 Document Revised: 06/16/2018 Document Reviewed: 06/16/2018 Elsevier Interactive Patient Education  2019 [employer].   Epinephrine injection (Auto-injector) What is this medicine? EPINEPHRINE (ep i NEF rin) is used for the emergency treatment of severe allergic reactions. You should keep this medicine with you at all times. This medicine may be used for other purposes; ask your health care provider or pharmacist if you have questions. COMMON BRAND NAME(S): Adrenaclick, Auvi-Q, epinephrinesnap, epinephrinesnap-v, EpiPen, EPIsnap Epinephrine, SYMJEPI, Twinject What should I tell my health care provider before I take this medicine? They need to know if you have any of the following conditions: -diabetes -heart disease -high blood pressure -lung or breathing disease, like asthma -Parkinson's disease -thyroid disease -an unusual or allergic reaction to epinephrine, sulfites, other medicines, foods, dyes, or preservatives -pregnant or trying to get pregnant -breast-feeding How should I use this medicine? This medicine is for injection into the outer thigh. Your doctor or health care professional will instruct you on the proper use of the device during an emergency. Read all directions carefully and make sure you understand them. Do not use more often than directed. Talk to your pediatrician regarding the use of this medicine in children. Special care may be needed. This drug is commonly used in children. A special device is available for use in children. If you are giving this medicine to a young child, hold their leg firmly in place before and during the injection to prevent injury. Overdosage: If you think you have  taken too much of this medicine contact a poison control center or emergency room at once. NOTE: This medicine is only for you. Do not share this medicine with others. What if I miss a dose? This does not apply. You should only use this medicine for an allergic reaction. What may interact with this medicine? This medicine is only used during an emergency. Significant drug interactions are not likely during emergency use. This list may not describe all possible interactions. Give your health care provider a list of all the medicines, herbs, non-prescription drugs, or dietary supplements you use. Also tell them if you smoke, drink alcohol, or use illegal drugs. Some items may interact with your medicine. What should I watch for while using this medicine? Keep this medicine ready for use in the case of a severe allergic reaction. Make sure that you have the phone number of your doctor or health care professional and local hospital ready. Remember to check the expiration date of your medicine regularly. You may need to have additional units of this medicine with you at work, school, or other places. Talk to your doctor or health care professional about your need for extra units. Some emergencies may require an  additional dose. Check with your doctor or a health care professional before using an extra dose. After use, go to the nearest hospital or call 911. Avoid physical activity. Make sure the treating health care professional knows you have received an injection of this medicine. You will receive additional instructions on what to do during and after use of this medicine before a medical emergency occurs. What side effects may I notice from receiving this medicine? Side effects that you should report to your doctor or health care professional as soon as possible: -allergic reactions like skin rash, itching or hives, swelling of the face, lips, or tongue -breathing problems -chest pain -fast, irregular  heartbeat -pain, tingling, numbness in the hands or feet -pain, redness, or irritation at site where injected -vomiting Side effects that usually do not require medical attention (report to your doctor or health care professional if they continue or are bothersome): -anxious -dizziness -dry mouth -headache -increased sweating -nausea -unusually weak or tired This list may not describe all possible side effects. Call your doctor for medical advice about side effects. You may report side effects to FDA at 1-800-FDA-1088. Where should I keep my medicine? Keep out of the reach of children. Store at room temperature between 15 and 30 degrees C (59 and 86 degrees F). Protect from light and heat. The solution should be clear in color. If the solution is discolored or contains particles it must be replaced. Throw away any unused medicine after the expiration date. Ask your doctor or pharmacist about proper disposal of the injector if it is expired or has been used. Always replace your auto-injector before it expires. NOTE: This sheet is a summary. It may not cover all possible information. If you have questions about this medicine, talk to your doctor, pharmacist, or health care provider.  2019 Elsevier/Gold Standard (2015-05-07 12:24:50)

## 2019-02-14 NOTE — Progress Notes (Signed)
Subjective:  Patient ID: Diane Sloan, female    DOB: Apr 20, 1966  Age: 53 y.o. MRN: [id number]  CC: Allergic Reaction (10pm on-set : itching, lipes / face swelling/ throat swelling has reduced in the last 10hrs, 4 am Benedryl last taken , Rx refill Lisonpril)  Allergic Reaction  This is a new problem. The current episode started yesterday. The problem occurs constantly. The problem is unchanged. The patient was exposed to an OTC medication (ibuprofen). The time of exposure was just prior to onset. The exposure occurred at home. Associated symptoms include eye itching, eye redness, eye watering, globus sensation, itching and a rash. Pertinent negatives include no coughing, difficulty breathing, drooling, hyperventilation, stridor, trouble swallowing, vomiting or wheezing. Swelling is present on the eyes and face. Past treatments include diphenhydramine. The treatment provided mild relief.   Has not taken paxil or lisinopril in over 40months  Reviewed past Medical, Social and Family history today.  Outpatient Medications Prior to Visit  Medication Sig Dispense Refill  . lisinopril (PRINIVIL,ZESTRIL) 20 MG tablet Take 1 tablet (20 mg total) by mouth daily. For blood pressure. 30 tablet 1  . PARoxetine (PAXIL) 40 MG tablet Take 0.5 tablets (20 mg total) by mouth every morning. 30 tablet 1  . Omega 3 1000 MG CAPS Take 2 capsules (2,000 mg total) by mouth 2 (two) times daily. (Patient not taking: Reported on 02/14/2019) 120 each 3   No facility-administered medications prior to visit.     ROS See HPI  Objective:  BP (!) 138/92 (BP Location: Left Arm, Patient Position: Sitting, Cuff Size: Large)   Pulse (!) 58   Temp 98.2 F (36.8 C) (Oral)   Resp 20   Ht 5\' 4"  (1.626 m)   Wt 273 lb 9.6 oz (124.1 kg)   SpO2 98%   BMI 46.96 kg/m   BP Readings from Last 3 Encounters:  02/14/19 (!) 138/92  03/23/18 (!) 150/90  10/30/16 (!) 142/92    Wt Readings from Last 3 Encounters:  02/14/19  273 lb 9.6 oz (124.1 kg)  03/23/18 249 lb 1.3 oz (113 kg)  10/30/16 269 lb (122 kg)    Physical Exam Vitals signs reviewed.  HENT:     Head: No right periorbital erythema or left periorbital erythema.     Right Ear: Tympanic membrane, ear canal and external ear normal.     Left Ear: Tympanic membrane, ear canal and external ear normal.     Nose: Nose normal.     Mouth/Throat:     Mouth: Mucous membranes are moist. Angioedema present.     Pharynx: No posterior oropharyngeal erythema.     Comments: Upper and lower lip swelling Eyes:     Extraocular Movements: Extraocular movements intact.     Conjunctiva/sclera: Conjunctivae normal.     Comments: Left peri orbital swelling  Cardiovascular:     Rate and Rhythm: Normal rate and regular rhythm.     Pulses: Normal pulses.     Heart sounds: Normal heart sounds.  Pulmonary:     Effort: Pulmonary effort is normal.     Breath sounds: Normal breath sounds. No stridor. No wheezing or rhonchi.  Skin:    Findings: Erythema and rash present.  Neurological:     Mental Status: She is alert and oriented to person, place, and time.  Psychiatric:        Mood and Affect: Mood normal.        Behavior: Behavior normal.  Thought Content: Thought content normal.     Lab Results  Component Value Date   WBC 13.0 (H) 10/30/2016   HGB 11.7 (L) 10/30/2016   HCT 36.0 10/30/2016   PLT 607.0 (H) 10/30/2016   GLUCOSE 117 (H) 10/30/2016   CHOL 158 10/30/2016   TRIG 208.0 (H) 10/30/2016   HDL 55.10 10/30/2016   LDLDIRECT 86.0 10/30/2016   ALT 18 10/30/2016   AST 14 10/30/2016   NA 136 10/30/2016   K 3.8 10/30/2016   CL 102 10/30/2016   CREATININE 0.72 10/30/2016   BUN 8 10/30/2016   CO2 25 10/30/2016   TSH 0.79 10/30/2016   HGBA1C 5.6 10/30/2016    Assessment & Plan:   Waneda was seen today for allergic reaction.  Diagnoses and all orders for this visit:  Allergic reaction, initial encounter -     methylPREDNISolone acetate  (DEPO-MEDROL) injection 40 mg -     famotidine (PEPCID) 20 MG tablet; Take 1 tablet (20 mg total) by mouth 2 (two) times daily. -     diphenhydrAMINE (BENADRYL) 25 MG tablet; Take 1 tablet (25 mg total) by mouth every 8 (eight) hours as needed for itching or allergies. -     EPINEPHrine (EPIPEN 2-PAK) 0.3 mg/0.3 mL IJ SOAJ injection; Inject 0.3 mLs (0.3 mg total) into the muscle as needed for anaphylaxis. -     predniSONE (STERAPRED UNI-PAK 21 TAB) 10 MG (21) TBPK tablet; As directed on package  Hives -     methylPREDNISolone acetate (DEPO-MEDROL) injection 40 mg -     famotidine (PEPCID) 20 MG tablet; Take 1 tablet (20 mg total) by mouth 2 (two) times daily. -     diphenhydrAMINE (BENADRYL) 25 MG tablet; Take 1 tablet (25 mg total) by mouth every 8 (eight) hours as needed for itching or allergies. -     EPINEPHrine (EPIPEN 2-PAK) 0.3 mg/0.3 mL IJ SOAJ injection; Inject 0.3 mLs (0.3 mg total) into the muscle as needed for anaphylaxis. -     predniSONE (STERAPRED UNI-PAK 21 TAB) 10 MG (21) TBPK tablet; As directed on package   I am having Diane Sloan start on famotidine, diphenhydrAMINE, EPINEPHrine, and predniSONE. I am also having her maintain her Omega 3, lisinopril, and PARoxetine. We administered methylPREDNISolone acetate.  Meds ordered this encounter  Medications  . methylPREDNISolone acetate (DEPO-MEDROL) injection 40 mg  . famotidine (PEPCID) 20 MG tablet    Sig: Take 1 tablet (20 mg total) by mouth 2 (two) times daily.    Dispense:  14 tablet    Refill:  0    Order Specific Question:   Supervising Provider    Answer:   Lucille Passy [[id number]]  . diphenhydrAMINE (BENADRYL) 25 MG tablet    Sig: Take 1 tablet (25 mg total) by mouth every 8 (eight) hours as needed for itching or allergies.    Dispense:  30 tablet    Refill:  0    Order Specific Question:   Supervising Provider    Answer:   Lucille Passy [[id number]]  . EPINEPHrine (EPIPEN 2-PAK) 0.3 mg/0.3 mL IJ SOAJ injection     Sig: Inject 0.3 mLs (0.3 mg total) into the muscle as needed for anaphylaxis.    Dispense:  1 Device    Refill:  0    Order Specific Question:   Supervising Provider    Answer:   Lucille Passy [[id number]]  . predniSONE (STERAPRED UNI-PAK 21 TAB) 10 MG (21) TBPK tablet  Sig: As directed on package    Dispense:  21 tablet    Refill:  0    Order Specific Question:   Supervising Provider    Answer:   Lucille Passy [[id number]]    Problem List Items Addressed This Visit    None    Visit Diagnoses    Allergic reaction, initial encounter    -  Primary   Relevant Medications   methylPREDNISolone acetate (DEPO-MEDROL) injection 40 mg (Completed)   famotidine (PEPCID) 20 MG tablet   diphenhydrAMINE (BENADRYL) 25 MG tablet   EPINEPHrine (EPIPEN 2-PAK) 0.3 mg/0.3 mL IJ SOAJ injection   predniSONE (STERAPRED UNI-PAK 21 TAB) 10 MG (21) TBPK tablet (Start on 02/15/2019)   Hives       Relevant Medications   methylPREDNISolone acetate (DEPO-MEDROL) injection 40 mg (Completed)   famotidine (PEPCID) 20 MG tablet   diphenhydrAMINE (BENADRYL) 25 MG tablet   EPINEPHrine (EPIPEN 2-PAK) 0.3 mg/0.3 mL IJ SOAJ injection   predniSONE (STERAPRED UNI-PAK 21 TAB) 10 MG (21) TBPK tablet (Start on 02/15/2019)       Follow-up: Return in about 4 days (around 02/18/2019) for hives and HTN (fasting).  Wilfred Lacy, NP

## 2019-02-14 NOTE — Telephone Encounter (Signed)
Pt experiencing allergic reaction. Not sure what is causing it. She has been taking lisinopril but states has been off for about a week.  She has itching, swelling of bottom lip, some shortness of breath which all started last night. Per protocol, pt advised to go to ED. She is going to [facility]. Routing to flow at [facility] at [facility].  Reason for Disposition . Taking an ACE Inhibitor medication  (e.g., benazepril/LOTENSIN, captopril/CAPOTEN, enalapril/VASOTEC, lisinopril/ZESTRIL)  Answer Assessment - Initial Assessment Questions 1. ONSET: "When did the swelling start?" (e.g., minutes, hours, days)     Last night 2. SEVERITY: "How swollen is it?"     Bottom lip 3. ITCHING: "Is there any itching?" If so, ask: "How much?"   (Scale 1-10; mild, moderate or severe)     yes 4. PAIN: "Is the swelling painful to touch?" If so, ask: "How painful is it?"   (Scale 1-10; mild, moderate or severe)     n/a 5. CAUSE: "What do you think is causing the lip swelling?"     Not sure 6. RECURRENT SYMPTOM: "Have you had lip swelling before?" If so, ask: "When was the last time?" "What happened that time?"     Nothing new. Has been on lisinopril for about 5 years but has not taken since last Saturday 7. OTHER SYMPTOMS: "Do you have any other symptoms?" (e.g., toothache)     Hives, itching, shortness of breath 8. PREGNANCY: "Is there any chance you are pregnant?" "When was your last menstrual period?"     n/a  Protocols used: LIP [facility]

## 2019-02-18 ENCOUNTER — Encounter: Payer: Self-pay | Admitting: Nurse Practitioner

## 2019-02-18 ENCOUNTER — Ambulatory Visit (INDEPENDENT_AMBULATORY_CARE_PROVIDER_SITE_OTHER): Payer: PRIVATE HEALTH INSURANCE | Admitting: Nurse Practitioner

## 2019-02-18 VITALS — BP 138/88 | HR 90 | Temp 98.1°F | Ht 64.0 in | Wt 270.8 lb

## 2019-02-18 DIAGNOSIS — E781 Pure hyperglyceridemia: Secondary | ICD-10-CM

## 2019-02-18 DIAGNOSIS — Z1231 Encounter for screening mammogram for malignant neoplasm of breast: Secondary | ICD-10-CM | POA: Diagnosis not present

## 2019-02-18 DIAGNOSIS — Z0001 Encounter for general adult medical examination with abnormal findings: Secondary | ICD-10-CM

## 2019-02-18 DIAGNOSIS — R739 Hyperglycemia, unspecified: Secondary | ICD-10-CM | POA: Diagnosis not present

## 2019-02-18 DIAGNOSIS — L509 Urticaria, unspecified: Secondary | ICD-10-CM | POA: Diagnosis not present

## 2019-02-18 DIAGNOSIS — Z1211 Encounter for screening for malignant neoplasm of colon: Secondary | ICD-10-CM | POA: Diagnosis not present

## 2019-02-18 DIAGNOSIS — F329 Major depressive disorder, single episode, unspecified: Secondary | ICD-10-CM | POA: Diagnosis not present

## 2019-02-18 DIAGNOSIS — F419 Anxiety disorder, unspecified: Secondary | ICD-10-CM | POA: Diagnosis not present

## 2019-02-18 DIAGNOSIS — I10 Essential (primary) hypertension: Secondary | ICD-10-CM | POA: Diagnosis not present

## 2019-02-18 DIAGNOSIS — F32A Depression, unspecified: Secondary | ICD-10-CM

## 2019-02-18 DIAGNOSIS — L910 Hypertrophic scar: Secondary | ICD-10-CM

## 2019-02-18 LAB — COMPREHENSIVE METABOLIC PANEL
ALT: 16 U/L (ref 0–35)
AST: 10 U/L (ref 0–37)
Albumin: 4.3 g/dL (ref 3.5–5.2)
Alkaline Phosphatase: 69 U/L (ref 39–117)
BUN: 12 mg/dL (ref 6–23)
CALCIUM: 9.5 mg/dL (ref 8.4–10.5)
CO2: 23 mEq/L (ref 19–32)
Chloride: 101 mEq/L (ref 96–112)
Creatinine, Ser: 0.64 mg/dL (ref 0.40–1.20)
GFR: 97.35 mL/min (ref >60.00)
Glucose, Bld: 108 mg/dL — ABNORMAL HIGH (ref 70–99)
Potassium: 3.6 mEq/L (ref 3.5–5.1)
Sodium: 135 mEq/L (ref 135–145)
Total Bilirubin: 0.4 mg/dL (ref 0.2–1.2)
Total Protein: 8.1 g/dL (ref 6.0–8.3)

## 2019-02-18 LAB — LIPID PANEL
Cholesterol: 159 mg/dL (ref 0–200)
HDL: 70.6 mg/dL (ref >39.00)
LDL Cholesterol: 65 mg/dL (ref 0–99)
NonHDL: 88.46
Total CHOL/HDL Ratio: 2
Triglycerides: 118 mg/dL (ref 0.0–149.0)
VLDL: 23.6 mg/dL (ref 0.0–40.0)

## 2019-02-18 LAB — HEMOGLOBIN A1C: Hgb A1c MFr Bld: 5.4 % (ref 4.6–6.5)

## 2019-02-18 MED ORDER — PAROXETINE HCL 20 MG PO TABS: 20.0000 mg | ORAL_TABLET | ORAL | 1 refills | Status: DC

## 2019-02-18 MED ORDER — LISINOPRIL 5 MG PO TABS: 5.0000 mg | ORAL_TABLET | Freq: Every day | ORAL | 1 refills | Status: DC

## 2019-02-18 NOTE — Patient Instructions (Addendum)
Stable lab results Medication refill sent. Lisinopril dose decreased to 5mg . Monitor BP at home 2-3times a week. Return to office sooner if BP>150/90 x 2-3readings in 1week.  You will be contacted to schedule appt with GI, breast center and dermatology.  Will send medication refill after review of lab results (will decrease dose of lisinopril to 5mg )

## 2019-02-18 NOTE — Progress Notes (Signed)
Subjective:  Patient ID: Diane Sloan, female    DOB: 09-Jun-1966  Age: 53 y.o. MRN: [id number]  CC: Follow-up (4 days , HTN , HIves/ itching improved, fasting Yes, )  HPI  HTN: Stable BP without use of lisinopril for several months. BP Readings from Last 3 Encounters:  02/18/19 138/88  02/14/19 (!) 138/92  03/23/18 (!) 150/90   Hives: Think this was related to use of ibuprofen and naproxen a few hours prior to outbreak. Reports resolved hives and itching with oral prednisone and benadryl  Skin lesions:  Chronic, starts as pustules then changes to keloid. Located on chest and upper back. No lesions on axillary or under breast or groin region.  Reviewed past Medical, Social and Family history today.  Outpatient Medications Prior to Visit  Medication Sig Dispense Refill  . diphenhydrAMINE (BENADRYL) 25 MG tablet Take 1 tablet (25 mg total) by mouth every 8 (eight) hours as needed for itching or allergies. 30 tablet 0  . EPINEPHrine (EPIPEN 2-PAK) 0.3 mg/0.3 mL IJ SOAJ injection Inject 0.3 mLs (0.3 mg total) into the muscle as needed for anaphylaxis. 1 Device 0  . famotidine (PEPCID) 20 MG tablet Take 1 tablet (20 mg total) by mouth 2 (two) times daily. 14 tablet 0  . Omega 3 1000 MG CAPS Take 2 capsules (2,000 mg total) by mouth 2 (two) times daily. 120 each 3  . predniSONE (STERAPRED UNI-PAK 21 TAB) 10 MG (21) TBPK tablet As directed on package 21 tablet 0  . lisinopril (PRINIVIL,ZESTRIL) 20 MG tablet Take 1 tablet (20 mg total) by mouth daily. For blood pressure. 30 tablet 1  . PARoxetine (PAXIL) 40 MG tablet Take 0.5 tablets (20 mg total) by mouth every morning. 30 tablet 1   No facility-administered medications prior to visit.     ROS See HPI  Objective:  BP 138/88   Pulse 90   Temp 98.1 F (36.7 C) (Oral)   Ht 5\' 4"  (1.626 m)   Wt 270 lb 12.8 oz (122.8 kg)   BMI 46.48 kg/m   BP Readings from Last 3 Encounters:  02/18/19 138/88  02/14/19 (!) 138/92    03/23/18 (!) 150/90    Wt Readings from Last 3 Encounters:  02/18/19 270 lb 12.8 oz (122.8 kg)  02/14/19 273 lb 9.6 oz (124.1 kg)  03/23/18 249 lb 1.3 oz (113 kg)    Physical Exam Vitals signs reviewed.  Cardiovascular:     Rate and Rhythm: Normal rate and regular rhythm.     Pulses: Normal pulses.     Heart sounds: Normal heart sounds.  Pulmonary:     Effort: Pulmonary effort is normal.     Breath sounds: Normal breath sounds.  Musculoskeletal:     Right lower leg: No edema.     Left lower leg: No edema.  Skin:    Findings: No erythema.       Neurological:     Mental Status: She is alert and oriented to person, place, and time.  Psychiatric:        Mood and Affect: Mood normal.        Behavior: Behavior normal.        Thought Content: Thought content normal.     Lab Results  Component Value Date   WBC 13.0 (H) 10/30/2016   HGB 11.7 (L) 10/30/2016   HCT 36.0 10/30/2016   PLT 607.0 (H) 10/30/2016   GLUCOSE 108 (H) 02/18/2019   CHOL 159 02/18/2019   TRIG 118.0  02/18/2019   HDL 70.60 02/18/2019   LDLDIRECT 86.0 10/30/2016   LDLCALC 65 02/18/2019   ALT 16 02/18/2019   AST 10 02/18/2019   NA 135 02/18/2019   K 3.6 02/18/2019   CL 101 02/18/2019   CREATININE 0.64 02/18/2019   BUN 12 02/18/2019   CO2 23 02/18/2019   TSH 0.79 10/30/2016   HGBA1C 5.4 02/18/2019    Assessment & Plan:   Diane Sloan was seen today for follow-up.  Diagnoses and all orders for this visit:  Essential hypertension, benign -     Comprehensive metabolic panel -     lisinopril (PRINIVIL,ZESTRIL) 5 MG tablet; Take 1 tablet (5 mg total) by mouth daily. For blood pressure.  Colon cancer screening -     Ambulatory referral to Gastroenterology  Breast cancer screening by mammogram -     MM DIGITAL SCREENING BILATERAL; Future  Hives  Anxiety and depression -     Comprehensive metabolic panel -     PARoxetine (PAXIL) 20 MG tablet; Take 1 tablet (20 mg total) by mouth every  morning.  Hyperglycemia -     Comprehensive metabolic panel -     Hemoglobin A1c  Hypertriglyceridemia -     Lipid panel  Keloid scar of skin -     Ambulatory referral to Dermatology  Encounter for preventative adult health care exam with abnormal findings -     lisinopril (PRINIVIL,ZESTRIL) 5 MG tablet; Take 1 tablet (5 mg total) by mouth daily. For blood pressure. -     PARoxetine (PAXIL) 20 MG tablet; Take 1 tablet (20 mg total) by mouth every morning.   I have changed Diane Sloan's lisinopril and PARoxetine. I am also having her maintain her Omega 3, famotidine, diphenhydrAMINE, EPINEPHrine, and predniSONE.  Meds ordered this encounter  Medications  . lisinopril (PRINIVIL,ZESTRIL) 5 MG tablet    Sig: Take 1 tablet (5 mg total) by mouth daily. For blood pressure.    Dispense:  90 tablet    Refill:  1    Order Specific Question:   Supervising Provider    Answer:   Lucille Passy [[id number]]  . PARoxetine (PAXIL) 20 MG tablet    Sig: Take 1 tablet (20 mg total) by mouth every morning.    Dispense:  90 tablet    Refill:  1    Order Specific Question:   Supervising Provider    Answer:   Lucille Passy [[id number]]    Problem List Items Addressed This Visit      Cardiovascular and Mediastinum   Essential hypertension, benign - Primary   Relevant Medications   lisinopril (PRINIVIL,ZESTRIL) 5 MG tablet   Other Relevant Orders   Comprehensive metabolic panel (Completed)     Other   Anxiety and depression   Relevant Medications   PARoxetine (PAXIL) 20 MG tablet   Other Relevant Orders   Comprehensive metabolic panel (Completed)   Hyperglycemia   Relevant Orders   Comprehensive metabolic panel (Completed)   Hemoglobin A1c (Completed)   Hypertriglyceridemia   Relevant Medications   lisinopril (PRINIVIL,ZESTRIL) 5 MG tablet   Other Relevant Orders   Lipid panel (Completed)    Other Visit Diagnoses    Colon cancer screening       Relevant Orders   Ambulatory referral to  Gastroenterology   Breast cancer screening by mammogram       Relevant Orders   MM DIGITAL SCREENING BILATERAL   Hives       Keloid scar of  skin       Relevant Orders   Ambulatory referral to Dermatology   Encounter for preventative adult health care exam with abnormal findings       Relevant Medications   lisinopril (PRINIVIL,ZESTRIL) 5 MG tablet   PARoxetine (PAXIL) 20 MG tablet       Follow-up: Return in about 3 months (around 05/21/2019) for CPE (fasting).  Wilfred Lacy, NP

## 2019-03-02 ENCOUNTER — Ambulatory Visit: Payer: PRIVATE HEALTH INSURANCE | Admitting: Nurse Practitioner

## 2019-03-02 ENCOUNTER — Ambulatory Visit: Payer: Self-pay | Admitting: *Deleted

## 2019-03-02 NOTE — Telephone Encounter (Signed)
CN-This pt is scheduled for tomorrow with you/thx dmf

## 2019-03-02 NOTE — Telephone Encounter (Signed)
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        Reason for Disposition . [1] MILD difficulty breathing (e.g., minimal/no SOB at rest, SOB with walking, pulse <100) AND [2] NEW-onset or WORSE than normal  Answer Assessment - Initial Assessment Questions 1. RESPIRATORY STATUS: "Describe your breathing?" (e.g., wheezing, shortness of breath, unable to speak, severe coughing)      Difficult to take a deep breath  2. ONSET: "When did this breathing problem begin?"      03/02/2019 3. PATTERN "Does the difficult breathing come and go, or has it been constant since it started?"      Comes and goes 4. SEVERITY: "How bad is your breathing?" (e.g., mild, moderate, severe)    - MILD: No SOB at rest, mild SOB with walking, speaks normally in sentences, can lay down, no retractions, pulse < 100.    - MODERATE: SOB at rest, SOB with minimal exertion and prefers to sit, cannot lie down flat, speaks in phrases, mild retractions, audible wheezing, pulse 100-120.    - SEVERE: Very SOB at rest, speaks in single words, struggling to breathe, sitting hunched forward, retractions, pulse > 120      mild 5. RECURRENT SYMPTOM: "Have you had difficulty breathing  before?" If so, ask: "When was the last time?" and "What happened that time?"      no 6. CARDIAC HISTORY: "Do you have any history of heart disease?" (e.g., heart attack, angina, bypass surgery, angioplasty)      High blood pressure 7. LUNG HISTORY: "Do you have any history of lung disease?"  (e.g., pulmonary embolus, asthma, emphysema)     asthma 8. CAUSE: "What do you think is causing the breathing problem?"      Not sure  9. OTHER SYMPTOMS: "Do you have any other symptoms? (e.g., dizziness, runny nose, cough, chest pain, fever)     Runny nose, chest tightness 10. PREGNANCY: "Is there any chance you are pregnant?" "When was your last menstrual period?"       No LMP 02/25/2019 11. TRAVEL: "Have you traveled out of the country in the last month?" (e.g., travel history, exposures)       No travel, no to corona virus evaluation  Protocols used: BREATHING DIFFICULTY-A-AH

## 2019-03-02 NOTE — Telephone Encounter (Signed)
Please review high risk criteria with patient. Based on symptoms reported, she is considered low risk.  She needs to maintain appt tomorrow. She needs to wear surgical mask. She needs to be pulled from the side door.  Coronavirus (COVID-19) Are you at risk?  Are you at risk for the Coronavirus (COVID-19)?  To be considered HIGH RISK for Coronavirus (COVID-19), you have to meet the following criteria:  . Traveled to [place], [place], [place], [place] or [place]; or in the [place] to [place], [place], [place], or [place]; and have fever, cough, and shortness of breath within the last 2 weeks of travel OR . Been in close contact with a person diagnosed with COVID-19 within the last 2 weeks and have fever, cough, and shortness of breath . IF YOU DO NOT MEET THESE CRITERIA, YOU ARE CONSIDERED LOW RISK FOR COVID-19.  What to do if you are HIGH RISK for COVID-19?  Marland Kitchen If you are having a medical emergency, call 911. . Seek medical care right away. Before you go to a doctor's office, urgent care or emergency department, call ahead and tell them about your recent travel, contact with someone diagnosed with COVID-19, and your symptoms. You should receive instructions from your physician's office regarding next steps of care.  . When you arrive at healthcare provider, tell the healthcare staff immediately you have returned from visiting [place], [place], [place], [place] or [place]; or traveled in the [place] to [place], [place], [place], or [place]; in the last two weeks or you have been in close contact with a person diagnosed with COVID-19 in the last 2 weeks.   . Tell the health care staff about your symptoms: fever, cough and shortness of breath. . After you have been seen by a medical provider, you will be either: o Tested for (COVID-19) and discharged home on quarantine except to seek medical care if symptoms worsen, and asked to  - Stay home and avoid contact with others  until you get your results (4-5 days)  - Avoid travel on public transportation if possible (such as bus, train, or airplane) or o Sent to the Emergency Department by EMS for evaluation, COVID-19 testing, and possible admission depending on your condition and test results.  What to do if you are LOW RISK for COVID-19?  Reduce your risk of any infection by using the same precautions used for avoiding the common cold or flu:  Marland Kitchen Wash your hands often with soap and warm water for at least 20 seconds.  If soap and water are not readily available, use an alcohol-based hand sanitizer with at least 60% alcohol.  . If coughing or sneezing, cover your mouth and nose by coughing or sneezing into the elbow areas of your shirt or coat, into a tissue or into your sleeve (not your hands). . Avoid shaking hands with others and consider head nods or verbal greetings only. . Avoid touching your eyes, nose, or mouth with unwashed hands.  . Avoid close contact with people who are sick. . Avoid places or events with large numbers of people in one location, like concerts or sporting events. . Carefully consider travel plans you have or are making. . If you are planning any travel outside or inside the [place], visit the CDC's Travelers' Health webpage for the latest health notices. . If you have some symptoms but not all symptoms, continue to monitor at home and seek medical attention if your symptoms worsen. Marland Kitchen  If you are having a medical emergency, call 911.   [facility] / e-Visit: [email or web address]         [facility]: [facility]: [phone]                   [facility]: [phone]

## 2019-03-03 NOTE — Telephone Encounter (Signed)
Pt felt she no longer needed appt so she cancelled/thx dmf

## 2019-05-05 ENCOUNTER — Telehealth: Payer: Self-pay | Admitting: Nurse Practitioner

## 2019-05-05 NOTE — Telephone Encounter (Signed)
Called pt about message she sent in  Appointment Request From: Enos Fling Tippets    With Provider: Wilfred Lacy, NP [[facility]]    Preferred Date Range: Any date 05/04/2019 or later    Preferred Times: Any    Reason: To address the following health maintenance concerns.  Tetanus/Tdap  Pap Smear-Modifier  Colonoscopy    Comments:  ok   I had to leave a message but we also need to reschedule her CPE for a day that Baldo Ash will be in the office but we should be able to get all done the same day except the Colonoscopy would have to be a referral

## 2019-05-11 ENCOUNTER — Ambulatory Visit: Payer: PRIVATE HEALTH INSURANCE

## 2019-05-23 ENCOUNTER — Other Ambulatory Visit: Payer: Self-pay | Admitting: *Deleted

## 2019-05-23 DIAGNOSIS — Z20822 Contact with and (suspected) exposure to covid-19: Secondary | ICD-10-CM

## 2019-05-27 LAB — NOVEL CORONAVIRUS, NAA: SARS-CoV-2, NAA: NOT DETECTED

## 2019-05-30 ENCOUNTER — Encounter: Payer: PRIVATE HEALTH INSURANCE | Admitting: Nurse Practitioner

## 2019-06-03 ENCOUNTER — Encounter: Payer: PRIVATE HEALTH INSURANCE | Admitting: Nurse Practitioner

## 2019-06-03 ENCOUNTER — Encounter: Payer: Self-pay | Admitting: Nurse Practitioner

## 2019-06-15 ENCOUNTER — Encounter: Payer: Self-pay | Admitting: Nurse Practitioner

## 2019-06-15 ENCOUNTER — Telehealth: Payer: Self-pay | Admitting: Behavioral Health

## 2019-06-15 ENCOUNTER — Other Ambulatory Visit: Payer: Self-pay

## 2019-06-15 ENCOUNTER — Ambulatory Visit (INDEPENDENT_AMBULATORY_CARE_PROVIDER_SITE_OTHER): Payer: PRIVATE HEALTH INSURANCE | Admitting: Nurse Practitioner

## 2019-06-15 VITALS — BP 120/76 | HR 93 | Temp 97.8°F | Ht 63.39 in | Wt 284.0 lb

## 2019-06-15 DIAGNOSIS — D473 Essential (hemorrhagic) thrombocythemia: Secondary | ICD-10-CM | POA: Diagnosis not present

## 2019-06-15 DIAGNOSIS — F329 Major depressive disorder, single episode, unspecified: Secondary | ICD-10-CM | POA: Diagnosis not present

## 2019-06-15 DIAGNOSIS — R739 Hyperglycemia, unspecified: Secondary | ICD-10-CM | POA: Diagnosis not present

## 2019-06-15 DIAGNOSIS — Z0001 Encounter for general adult medical examination with abnormal findings: Secondary | ICD-10-CM | POA: Diagnosis not present

## 2019-06-15 DIAGNOSIS — Z7689 Persons encountering health services in other specified circumstances: Secondary | ICD-10-CM

## 2019-06-15 DIAGNOSIS — F419 Anxiety disorder, unspecified: Secondary | ICD-10-CM

## 2019-06-15 DIAGNOSIS — F32A Depression, unspecified: Secondary | ICD-10-CM

## 2019-06-15 DIAGNOSIS — E781 Pure hyperglyceridemia: Secondary | ICD-10-CM

## 2019-06-15 DIAGNOSIS — I10 Essential (primary) hypertension: Secondary | ICD-10-CM

## 2019-06-15 DIAGNOSIS — D75839 Thrombocytosis, unspecified: Secondary | ICD-10-CM

## 2019-06-15 LAB — CBC
HCT: 37.1 % (ref 36.0–46.0)
Hemoglobin: 11.9 g/dL — ABNORMAL LOW (ref 12.0–15.0)
MCHC: 32.2 g/dL (ref 30.0–36.0)
MCV: 89 fl (ref 78.0–100.0)
Platelets: 524 10*3/uL — ABNORMAL HIGH (ref 150.0–400.0)
RBC: 4.17 Mil/uL (ref 3.87–5.11)
RDW: 14.6 % (ref 11.5–15.5)
WBC: 9.3 10*3/uL (ref 4.0–10.5)

## 2019-06-15 LAB — TSH: TSH: 1.14 u[IU]/mL (ref 0.35–4.50)

## 2019-06-15 MED ORDER — PHENTERMINE HCL 15 MG PO CAPS: 15.0000 mg | ORAL_CAPSULE | ORAL | 0 refills | Status: DC

## 2019-06-15 NOTE — Progress Notes (Signed)
Subjective:    Patient ID: Diane Sloan, female    DOB: 1966/11/07, 53 y.o.   MRN: [id number]  Patient presents today for complete physical and eval of chronic conditions  HPI Obesity: BMI at 49. Has difficulty maintaining weight watchers diet plan. She has consulted for Bariatric surgery, waiting for reply. She will like to try an appetite suppressant Diet:regular. Exercise:limited due to recent knee injury 6weeks ago Wt Readings from Last 3 Encounters:  06/15/19 284 lb (128.8 kg)  02/18/19 270 lb 12.8 oz (122.8 kg)  02/14/19 273 lb 9.6 oz (124.1 kg)   HTN: Controlled with lisinopril BP Readings from Last 3 Encounters:  06/15/19 120/76  02/18/19 138/88  02/14/19 (!) 138/92   Sexual History (orientation,birth control, marital status, STD):married, sexually active, postmenopausal, has GYN to perform PAP.  Depression/Suicide: stable mood with paxil Depression screen [facility] 2/9 02/14/2019 10/30/2016  Decreased Interest 0 0  Down, Depressed, Hopeless 0 0  PHQ - 2 Score 0 0   Vision:up to date  Dental:up to date, has lower partials  Immunizations: (TDAP, Hep C screen, Pneumovax, Influenza, zoster)  Health Maintenance  Topic Date Due  . Pap Smear  11/19/1987  . Mammogram  11/18/2016  . Colon Cancer Screening  11/18/2016  . HIV Screening  02/18/2020*  . Tetanus Vaccine  06/14/2020*  . Flu Shot  07/16/2019  *Topic was postponed. The date shown is not the original due date.    Weight:  Wt Readings from Last 3 Encounters:  06/15/19 284 lb (128.8 kg)  02/18/19 270 lb 12.8 oz (122.8 kg)  02/14/19 273 lb 9.6 oz (124.1 kg)     Advanced Directive: Advanced Directives 10/30/2016  Does Patient Have a Medical Advance Directive? No  Would patient like information on creating a medical advance directive? No - patient declined information     Medications and allergies reviewed with patient and updated if appropriate.  Patient Active Problem List   Diagnosis Date Noted   . Thrombocytosis ([place]) 06/16/2019  . Hypertriglyceridemia 10/31/2016  . Hyperglycemia 10/31/2016  . Essential hypertension, benign 10/21/2016  . Anxiety and depression 10/21/2016    Current Outpatient Medications on File Prior to Visit  Medication Sig Dispense Refill  . diphenhydrAMINE (BENADRYL) 25 MG tablet Take 1 tablet (25 mg total) by mouth every 8 (eight) hours as needed for itching or allergies. 30 tablet 0  . EPINEPHrine (EPIPEN 2-PAK) 0.3 mg/0.3 mL IJ SOAJ injection Inject 0.3 mLs (0.3 mg total) into the muscle as needed for anaphylaxis. 1 Device 0  . famotidine (PEPCID) 20 MG tablet Take 1 tablet (20 mg total) by mouth 2 (two) times daily. 14 tablet 0  . lisinopril (PRINIVIL,ZESTRIL) 5 MG tablet Take 1 tablet (5 mg total) by mouth daily. For blood pressure. 90 tablet 1  . Omega 3 1000 MG CAPS Take 2 capsules (2,000 mg total) by mouth 2 (two) times daily. 120 each 3  . PARoxetine (PAXIL) 20 MG tablet Take 1 tablet (20 mg total) by mouth every morning. 90 tablet 1  . predniSONE (STERAPRED UNI-PAK 21 TAB) 10 MG (21) TBPK tablet As directed on package (Patient not taking: Reported on 06/15/2019) 21 tablet 0   No current facility-administered medications on file prior to visit.     Past Medical History:  Diagnosis Date  . Anxiety   . Hypertension     Past Surgical History:  Procedure Laterality Date  . TUBAL LIGATION      Social History   Socioeconomic History  .  Marital status: Married    Spouse name: Not on file  . Number of children: 3  . Years of education: Not on file  . Highest education level: Not on file  Occupational History  . Not on file  Social Needs  . Financial resource strain: Not on file  . Food insecurity    Worry: Not on file    Inability: Not on file  . Transportation needs    Medical: Not on file    Non-medical: Not on file  Tobacco Use  . Smoking status: Never Smoker  . Smokeless tobacco: Never Used  Substance and Sexual Activity  .  Alcohol use: Yes    Comment: social  . Drug use: No  . Sexual activity: Yes    Birth control/protection: Surgical  Lifestyle  . Physical activity    Days per week: Not on file    Minutes per session: Not on file  . Stress: Not on file  Relationships  . Social Herbalist on phone: Not on file    Gets together: Not on file    Attends religious service: Not on file    Active member of club or organization: Not on file    Attends meetings of clubs or organizations: Not on file    Relationship status: Not on file  Other Topics Concern  . Not on file  Social History Narrative  . Not on file    Family History  Problem Relation Age of Onset  . Hypertension Mother   . Stroke Mother 49       hemorrhagic  . Diabetes Maternal Grandmother         Review of Systems  Constitutional: Negative for fever, malaise/fatigue and weight loss.  HENT: Negative for congestion and sore throat.   Eyes:       Negative for visual changes  Respiratory: Negative for cough and shortness of breath.   Cardiovascular: Negative for chest pain, palpitations and leg swelling.  Gastrointestinal: Negative for blood in stool, constipation, diarrhea and heartburn.  Genitourinary: Negative for dysuria, frequency and urgency.  Musculoskeletal: Negative for falls, joint pain and myalgias.  Skin: Negative for rash.  Neurological: Negative for dizziness, sensory change and headaches.  Endo/Heme/Allergies: Does not bruise/bleed easily.  Psychiatric/Behavioral: Negative for depression, substance abuse and suicidal ideas. The patient is not nervous/anxious and does not have insomnia.     Objective:   Vitals:   06/15/19 0935  BP: 120/76  Pulse: 93  Temp: 97.8 F (36.6 C)  SpO2: 99%   Body mass index is 49.7 kg/m.  Physical Examination:  Physical Exam Vitals signs reviewed.  Constitutional:      General: She is not in acute distress.    Appearance: She is well-developed. She is obese.   HENT:     Right Ear: Tympanic membrane, ear canal and external ear normal.     Left Ear: Tympanic membrane, ear canal and external ear normal.     Nose: Nose normal.     Mouth/Throat:     Mouth: Mucous membranes are moist.     Pharynx: No oropharyngeal exudate or posterior oropharyngeal erythema.  Eyes:     Extraocular Movements: Extraocular movements intact.     Conjunctiva/sclera: Conjunctivae normal.     Pupils: Pupils are equal, round, and reactive to light.  Neck:     Musculoskeletal: Normal range of motion and neck supple.  Cardiovascular:     Rate and Rhythm: Normal rate and regular rhythm.  Pulses: Normal pulses.     Heart sounds: Normal heart sounds.  Pulmonary:     Effort: Pulmonary effort is normal. No respiratory distress.     Breath sounds: Normal breath sounds.  Chest:     Chest wall: No tenderness.  Abdominal:     General: Bowel sounds are normal.  Genitourinary:    Comments: Breast and pelvic exam deferred to GYN Musculoskeletal: Normal range of motion.        General: No swelling.     Right lower leg: No edema.     Left lower leg: No edema.  Lymphadenopathy:     Cervical: No cervical adenopathy.  Skin:    General: Skin is warm and dry.  Neurological:     Mental Status: She is alert and oriented to person, place, and time.     Deep Tendon Reflexes: Reflexes are normal and symmetric.  Psychiatric:        Mood and Affect: Mood normal.        Behavior: Behavior normal.        Thought Content: Thought content normal.        Judgment: Judgment normal.    ASSESSMENT and PLAN:  Rafael was seen today for annual exam.  Diagnoses and all orders for this visit:  Encounter for preventative adult health care exam with abnormal findings  Essential hypertension, benign -     CBC  Anxiety and depression -     TSH  Hyperglycemia  Hypertriglyceridemia  Morbid obesity ([place]) -     phentermine 15 MG capsule; Take 1 capsule (15 mg total) by mouth every  morning.  Encounter for weight management -     phentermine 15 MG capsule; Take 1 capsule (15 mg total) by mouth every morning.  Thrombocytosis (H[facility])   Thrombocytosis (HCC) Platelet count 600-500 in last 1year. Asymptomatic Repeat cbc with diff and peripheral smear during next office visit     Problem List Items Addressed This Visit      Cardiovascular and Mediastinum   Essential hypertension, benign   Relevant Orders   CBC (Completed)     Hematopoietic and Hemostatic   Thrombocytosis (HCC)    Platelet count 600-500 in last 1year. Asymptomatic Repeat cbc with diff and peripheral smear during next office visit        Other   Anxiety and depression   Relevant Orders   TSH (Completed)   Hyperglycemia   Hypertriglyceridemia    Other Visit Diagnoses    Encounter for preventative adult health care exam with abnormal findings    -  Primary   Morbid obesity ([place])       Relevant Medications   phentermine 15 MG capsule   Encounter for weight management       Relevant Medications   phentermine 15 MG capsule      Follow up: Return in about 2 weeks (around 06/29/2019) for HTN and weight loss management.Wilfred Lacy, NP

## 2019-06-15 NOTE — Telephone Encounter (Signed)

## 2019-06-15 NOTE — Patient Instructions (Addendum)
Normal thyroid function. CBC indicates persistent high platelet count. In the absence of symptoms, we will continue to monitor. Plan to repeat labs during next OV  Continue current medications.  Have current PAP faxed to [place].  Schedule appt for colonoscopy  Start phentermine 15mg  with breakfast. Start walking 75mins daily. F/up in 2weeks.  Calorie Counting for Weight Loss Calories are units of energy. Your body needs a certain amount of calories from food to keep you going throughout the day. When you eat more calories than your body needs, your body stores the extra calories as fat. When you eat fewer calories than your body needs, your body burns fat to get the energy it needs. Calorie counting means keeping track of how many calories you eat and drink each day. Calorie counting can be helpful if you need to lose weight. If you make sure to eat fewer calories than your body needs, you should lose weight. Ask your health care provider what a healthy weight is for you. For calorie counting to work, you will need to eat the right number of calories in a day in order to lose a healthy amount of weight per week. A dietitian can help you determine how many calories you need in a day and will give you suggestions on how to reach your calorie goal.  A healthy amount of weight to lose per week is usually 1-2 lb (0.5-0.9 kg). This usually means that your daily calorie intake should be reduced by 500-750 calories.  Eating 1,200 - 1,500 calories per day can help most women lose weight.  Eating 1,500 - 1,800 calories per day can help most men lose weight. What is my plan? My goal is to have ___1800_______ calories per day. If I have this many calories per day, I should lose around ___0.5_______ pounds per week. What do I need to know about calorie counting? In order to meet your daily calorie goal, you will need to:  Find out how many calories are in each food you would like to eat. Try to do this  before you eat.  Decide how much of the food you plan to eat.  Write down what you ate and how many calories it had. Doing this is called keeping a food log. To successfully lose weight, it is important to balance calorie counting with a healthy lifestyle that includes regular activity. Aim for 150 minutes of moderate exercise (such as walking) or 75 minutes of vigorous exercise (such as running) each week. Where do I find calorie information?  The number of calories in a food can be found on a Nutrition Facts label. If a food does not have a Nutrition Facts label, try to look up the calories online or ask your dietitian for help. Remember that calories are listed per serving. If you choose to have more than one serving of a food, you will have to multiply the calories per serving by the amount of servings you plan to eat. For example, the label on a package of bread might say that a serving size is 1 slice and that there are 90 calories in a serving. If you eat 1 slice, you will have eaten 90 calories. If you eat 2 slices, you will have eaten 180 calories. How do I keep a food log? Immediately after each meal, record the following information in your food log:  What you ate. Don't forget to include toppings, sauces, and other extras on the food.  How much you  ate. This can be measured in cups, ounces, or number of items.  How many calories each food and drink had.  The total number of calories in the meal. Keep your food log near you, such as in a small notebook in your pocket, or use a mobile app or website. Some programs will calculate calories for you and show you how many calories you have left for the day to meet your goal. What are some calorie counting tips?   Use your calories on foods and drinks that will fill you up and not leave you hungry: ? Some examples of foods that fill you up are nuts and nut butters, vegetables, lean proteins, and high-fiber foods like whole grains.  High-fiber foods are foods with more than 5 g fiber per serving. ? Drinks such as sodas, specialty coffee drinks, alcohol, and juices have a lot of calories, yet do not fill you up.  Eat nutritious foods and avoid empty calories. Empty calories are calories you get from foods or beverages that do not have many vitamins or protein, such as candy, sweets, and soda. It is better to have a nutritious high-calorie food (such as an avocado) than a food with few nutrients (such as a bag of chips).  Know how many calories are in the foods you eat most often. This will help you calculate calorie counts faster.  Pay attention to calories in drinks. Low-calorie drinks include water and unsweetened drinks.  Pay attention to nutrition labels for "low fat" or "fat free" foods. These foods sometimes have the same amount of calories or more calories than the full fat versions. They also often have added sugar, starch, or salt, to make up for flavor that was removed with the fat.  Find a way of tracking calories that works for you. Get creative. Try different apps or programs if writing down calories does not work for you. What are some portion control tips?  Know how many calories are in a serving. This will help you know how many servings of a certain food you can have.  Use a measuring cup to measure serving sizes. You could also try weighing out portions on a kitchen scale. With time, you will be able to estimate serving sizes for some foods.  Take some time to put servings of different foods on your favorite plates, bowls, and cups so you know what a serving looks like.  Try not to eat straight from a bag or box. Doing this can lead to overeating. Put the amount you would like to eat in a cup or on a plate to make sure you are eating the right portion.  Use smaller plates, glasses, and bowls to prevent overeating.  Try not to multitask (for example, watch TV or use your computer) while eating. If it is  time to eat, sit down at a table and enjoy your food. This will help you to know when you are full. It will also help you to be aware of what you are eating and how much you are eating. What are tips for following this plan? Reading food labels  Check the calorie count compared to the serving size. The serving size may be smaller than what you are used to eating.  Check the source of the calories. Make sure the food you are eating is high in vitamins and protein and low in saturated and trans fats. Shopping  Read nutrition labels while you shop. This will help you make healthy  decisions before you decide to purchase your food.  Make a grocery list and stick to it. Cooking  Try to cook your favorite foods in a healthier way. For example, try baking instead of frying.  Use low-fat dairy products. Meal planning  Use more fruits and vegetables. Half of your plate should be fruits and vegetables.  Include lean proteins like poultry and fish. How do I count calories when eating out?  Ask for smaller portion sizes.  Consider sharing an entree and sides instead of getting your own entree.  If you get your own entree, eat only half. Ask for a box at the beginning of your meal and put the rest of your entree in it so you are not tempted to eat it.  If calories are listed on the menu, choose the lower calorie options.  Choose dishes that include vegetables, fruits, whole grains, low-fat dairy products, and lean protein.  Choose items that are boiled, broiled, grilled, or steamed. Stay away from items that are buttered, battered, fried, or served with cream sauce. Items labeled "crispy" are usually fried, unless stated otherwise.  Choose water, low-fat milk, unsweetened iced tea, or other drinks without added sugar. If you want an alcoholic beverage, choose a lower calorie option such as a glass of wine or light beer.  Ask for dressings, sauces, and syrups on the side. These are usually  high in calories, so you should limit the amount you eat.  If you want a salad, choose a garden salad and ask for grilled meats. Avoid extra toppings like bacon, cheese, or fried items. Ask for the dressing on the side, or ask for olive oil and vinegar or lemon to use as dressing.  Estimate how many servings of a food you are given. For example, a serving of cooked rice is  cup or about the size of half a baseball. Knowing serving sizes will help you be aware of how much food you are eating at restaurants. The list below tells you how big or small some common portion sizes are based on everyday objects: ? 1 oz--4 stacked dice. ? 3 oz--1 deck of cards. ? 1 tsp--1 die. ? 1 Tbsp-- a ping-pong ball. ? 2 Tbsp--1 ping-pong ball. ?  cup-- baseball. ? 1 cup--1 baseball. Summary  Calorie counting means keeping track of how many calories you eat and drink each day. If you eat fewer calories than your body needs, you should lose weight.  A healthy amount of weight to lose per week is usually 1-2 lb (0.5-0.9 kg). This usually means reducing your daily calorie intake by 500-750 calories.  The number of calories in a food can be found on a Nutrition Facts label. If a food does not have a Nutrition Facts label, try to look up the calories online or ask your dietitian for help.  Use your calories on foods and drinks that will fill you up, and not on foods and drinks that will leave you hungry.  Use smaller plates, glasses, and bowls to prevent overeating. This information is not intended to replace advice given to you by your health care provider. Make sure you discuss any questions you have with your health care provider. Document Released: 12/01/2005 Document Revised: 08/20/2018 Document Reviewed: 10/31/2016 Elsevier Patient Education  2020 [employer].

## 2019-06-16 DIAGNOSIS — D473 Essential (hemorrhagic) thrombocythemia: Secondary | ICD-10-CM | POA: Insufficient documentation

## 2019-06-16 DIAGNOSIS — D75839 Thrombocytosis, unspecified: Secondary | ICD-10-CM | POA: Insufficient documentation

## 2019-06-16 NOTE — Assessment & Plan Note (Signed)
Platelet count 600-500 in last 1year. Asymptomatic Repeat cbc with diff and peripheral smear during next office visit

## 2019-06-20 ENCOUNTER — Ambulatory Visit: Payer: Self-pay | Admitting: *Deleted

## 2019-06-20 NOTE — Telephone Encounter (Signed)
Attempted to call patient regarding lab results. No answer. Left message for patient to return call. Message is still listed in result note for patient when she calls back.

## 2019-07-04 ENCOUNTER — Telehealth: Payer: Self-pay | Admitting: Nurse Practitioner

## 2019-07-04 NOTE — Telephone Encounter (Signed)

## 2019-07-05 ENCOUNTER — Encounter: Payer: Self-pay | Admitting: Nurse Practitioner

## 2019-07-05 ENCOUNTER — Ambulatory Visit (INDEPENDENT_AMBULATORY_CARE_PROVIDER_SITE_OTHER): Payer: PRIVATE HEALTH INSURANCE | Admitting: Nurse Practitioner

## 2019-07-05 VITALS — BP 132/78 | HR 97 | Temp 98.2°F | Ht 63.39 in | Wt 277.6 lb

## 2019-07-05 DIAGNOSIS — D75839 Thrombocytosis, unspecified: Secondary | ICD-10-CM

## 2019-07-05 DIAGNOSIS — I10 Essential (primary) hypertension: Secondary | ICD-10-CM

## 2019-07-05 DIAGNOSIS — Z7689 Persons encountering health services in other specified circumstances: Secondary | ICD-10-CM

## 2019-07-05 DIAGNOSIS — D473 Essential (hemorrhagic) thrombocythemia: Secondary | ICD-10-CM | POA: Diagnosis not present

## 2019-07-05 DIAGNOSIS — Z6841 Body Mass Index (BMI) 40.0 and over, adult: Secondary | ICD-10-CM

## 2019-07-05 MED ORDER — PHENTERMINE HCL 15 MG PO CAPS: 15.0000 mg | ORAL_CAPSULE | ORAL | 0 refills | Status: DC

## 2019-07-05 NOTE — Progress Notes (Signed)
Subjective:  Patient ID: Diane Sloan, female    DOB: 07-15-1966  Age: 53 y.o. MRN: [id number]  CC: Follow-up (BP check)  HPI  Obesity: BMI of 48 Lost 7lbs since last OV 3week ago with use of phentermine 15mg . denies ay adverse effects with medication. Stable BP BP Readings from Last 3 Encounters:  07/05/19 132/78  06/15/19 120/76  02/18/19 138/88   Reviewed past Medical, Social and Family history today.  Outpatient Medications Prior to Visit  Medication Sig Dispense Refill  . diphenhydrAMINE (BENADRYL) 25 MG tablet Take 1 tablet (25 mg total) by mouth every 8 (eight) hours as needed for itching or allergies. 30 tablet 0  . EPINEPHrine (EPIPEN 2-PAK) 0.3 mg/0.3 mL IJ SOAJ injection Inject 0.3 mLs (0.3 mg total) into the muscle as needed for anaphylaxis. 1 Device 0  . famotidine (PEPCID) 20 MG tablet Take 1 tablet (20 mg total) by mouth 2 (two) times daily. 14 tablet 0  . lisinopril (PRINIVIL,ZESTRIL) 5 MG tablet Take 1 tablet (5 mg total) by mouth daily. For blood pressure. 90 tablet 1  . Omega 3 1000 MG CAPS Take 2 capsules (2,000 mg total) by mouth 2 (two) times daily. 120 each 3  . PARoxetine (PAXIL) 20 MG tablet Take 1 tablet (20 mg total) by mouth every morning. 90 tablet 1  . predniSONE (STERAPRED UNI-PAK 21 TAB) 10 MG (21) TBPK tablet As directed on package 21 tablet 0  . phentermine 15 MG capsule Take 1 capsule (15 mg total) by mouth every morning. 15 capsule 0   No facility-administered medications prior to visit.     ROS See HPI  Objective:  BP 132/78   Pulse 97   Temp 98.2 F (36.8 C) (Oral)   Ht 5' 3.39" (1.61 m)   Wt 277 lb 9.6 oz (125.9 kg)   SpO2 97%   BMI 48.57 kg/m   BP Readings from Last 3 Encounters:  07/05/19 132/78  06/15/19 120/76  02/18/19 138/88    Wt Readings from Last 3 Encounters:  07/05/19 277 lb 9.6 oz (125.9 kg)  06/15/19 284 lb (128.8 kg)  02/18/19 270 lb 12.8 oz (122.8 kg)    Physical Exam Vitals signs reviewed.   Constitutional:      Appearance: She is obese.  Cardiovascular:     Pulses: Normal pulses.     Heart sounds: Normal heart sounds.  Pulmonary:     Effort: Pulmonary effort is normal.  Musculoskeletal:     Right lower leg: No edema.     Left lower leg: No edema.  Psychiatric:        Mood and Affect: Mood normal.        Behavior: Behavior normal.        Thought Content: Thought content normal.    Lab Results  Component Value Date   WBC 9.3 06/15/2019   HGB 11.9 (L) 06/15/2019   HCT 37.1 06/15/2019   PLT 524.0 (H) 06/15/2019   GLUCOSE 108 (H) 02/18/2019   CHOL 159 02/18/2019   TRIG 118.0 02/18/2019   HDL 70.60 02/18/2019   LDLDIRECT 86.0 10/30/2016   LDLCALC 65 02/18/2019   ALT 16 02/18/2019   AST 10 02/18/2019   NA 135 02/18/2019   K 3.6 02/18/2019   CL 101 02/18/2019   CREATININE 0.64 02/18/2019   BUN 12 02/18/2019   CO2 23 02/18/2019   TSH 1.14 06/15/2019   HGBA1C 5.4 02/18/2019    No results found.  Assessment & Plan:  Roslyn was seen today for follow-up.  Diagnoses and all orders for this visit:  Encounter for weight management -     phentermine 15 MG capsule; Take 1 capsule (15 mg total) by mouth every morning. -     Cancel: Pathologist smear review  Thrombocytosis ([place]) -     Cancel: CBC w/Diff -     Cancel: Smear Review per Protocol -     Cancel: Pathologist smear review -     CBC -     Cancel: Pathologist smear review -     Pathologist smear review; Future  Essential hypertension, benign -     Cancel: Pathologist smear review  Class 3 severe obesity due to excess calories with serious comorbidity and body mass index (BMI) of 45.0 to 49.9 in adult ([place]) -     phentermine 15 MG capsule; Take 1 capsule (15 mg total) by mouth every morning. -     Cancel: Pathologist smear review   I am having Galen Vanvranken maintain her Omega 3, famotidine, diphenhydrAMINE, EPINEPHrine, predniSONE, lisinopril, PARoxetine, and phentermine.  Meds ordered  this encounter  Medications  . phentermine 15 MG capsule    Sig: Take 1 capsule (15 mg total) by mouth every morning.    Dispense:  30 capsule    Refill:  0    Order Specific Question:   Supervising Provider    Answer:   Lucille Passy [[id number]]    Problem List Items Addressed This Visit      Cardiovascular and Mediastinum   Essential hypertension, benign     Hematopoietic and Hemostatic   Thrombocytosis ([place])   Relevant Orders   CBC   Pathologist smear review    Other Visit Diagnoses    Encounter for weight management    -  Primary   Relevant Medications   phentermine 15 MG capsule   Class 3 severe obesity due to excess calories with serious comorbidity and body mass index (BMI) of 45.0 to 49.9 in adult [facility])       Relevant Medications   phentermine 15 MG capsule       Follow-up: Return in about 4 weeks (around 08/02/2019) for weight management and HTN.  Wilfred Lacy, NP

## 2019-07-05 NOTE — Patient Instructions (Signed)
Go to lab for blood draw.

## 2019-07-06 LAB — CBC
HCT: 38.7 % (ref 36.0–46.0)
Hemoglobin: 12.7 g/dL (ref 12.0–15.0)
MCHC: 32.9 g/dL (ref 30.0–36.0)
MCV: 89.4 fl (ref 78.0–100.0)
Platelets: 454 10*3/uL — ABNORMAL HIGH (ref 150.0–400.0)
RBC: 4.33 Mil/uL (ref 3.87–5.11)
RDW: 14.3 % (ref 11.5–15.5)
WBC: 10.4 10*3/uL (ref 4.0–10.5)

## 2019-07-06 NOTE — Addendum Note (Signed)
Addended by: Lynnea Ferrier on: 07/06/2019 07:39 AM   Modules accepted: Orders

## 2019-07-07 LAB — PATHOLOGIST SMEAR REVIEW

## 2019-08-15 ENCOUNTER — Other Ambulatory Visit: Payer: Self-pay | Admitting: Nurse Practitioner

## 2019-08-15 DIAGNOSIS — Z6841 Body Mass Index (BMI) 40.0 and over, adult: Secondary | ICD-10-CM

## 2019-08-15 DIAGNOSIS — Z7689 Persons encountering health services in other specified circumstances: Secondary | ICD-10-CM

## 2019-08-19 ENCOUNTER — Other Ambulatory Visit: Payer: Self-pay

## 2019-08-19 DIAGNOSIS — Z20822 Contact with and (suspected) exposure to covid-19: Secondary | ICD-10-CM

## 2019-08-20 LAB — NOVEL CORONAVIRUS, NAA: SARS-CoV-2, NAA: NOT DETECTED

## 2019-08-24 ENCOUNTER — Other Ambulatory Visit: Payer: Self-pay | Admitting: Nurse Practitioner

## 2019-08-24 DIAGNOSIS — Z6841 Body Mass Index (BMI) 40.0 and over, adult: Secondary | ICD-10-CM

## 2019-08-24 DIAGNOSIS — Z7689 Persons encountering health services in other specified circumstances: Secondary | ICD-10-CM

## 2019-08-24 NOTE — Telephone Encounter (Signed)
Pt request refill  phentermine 15 MG capsule   [place] ([place]), [place] - [address] [phone] (Phone) [phone] (Fax)

## 2019-08-24 NOTE — Telephone Encounter (Signed)
That is 3days from today, so I will prefer to wait till her appt

## 2019-08-24 NOTE — Telephone Encounter (Signed)
Requested medication (s) are due for refill today: yes  Requested medication (s) are on the active medication list: yes  Last refill:  07/05/2019  Future visit scheduled: no  Notes to clinic: This refill cannot be delegated   Requested Prescriptions  Pending Prescriptions Disp Refills   phentermine 15 MG capsule 30 capsule 0    Sig: Take 1 capsule (15 mg total) by mouth every morning.     Not Delegated - Gastroenterology:  Antiobesity Agents Failed - 08/24/2019 12:10 PM      Failed - This refill cannot be delegated      Passed - Last BP in normal range    BP Readings from Last 1 Encounters:  07/05/19 132/78         Passed - Last Heart Rate in normal range    Pulse Readings from Last 1 Encounters:  07/05/19 97         Passed - Valid encounter within last 12 months    Recent Outpatient Visits          1 month ago Encounter for weight management   [facility] [place], Charlene Brooke, NP   2 months ago Encounter for preventative adult health care exam with abnormal findings   [facility] [place], Charlene Brooke, NP   6 months ago Essential hypertension, benign   [facility] [place], Charlene Brooke, NP   6 months ago Allergic reaction, initial encounter   [facility] [place], Charlene Brooke, NP   1 year ago Essential hypertension, benign   [facility], Enid Baas, MD

## 2019-08-24 NOTE — Telephone Encounter (Signed)
Pt was wondering if Diane Sloan can send in some until she comes in for the appt on 08/26/2019 (pt is out).

## 2019-08-24 NOTE — Telephone Encounter (Signed)
LVM for the pt to call back, need to inform pt Charlotte's message.

## 2019-08-25 ENCOUNTER — Telehealth: Payer: Self-pay | Admitting: Nurse Practitioner

## 2019-08-25 NOTE — Telephone Encounter (Signed)

## 2019-08-25 NOTE — Telephone Encounter (Signed)
Pt is aware.  

## 2019-08-26 ENCOUNTER — Ambulatory Visit: Payer: PRIVATE HEALTH INSURANCE | Admitting: Nurse Practitioner

## 2019-10-17 ENCOUNTER — Other Ambulatory Visit: Payer: Self-pay

## 2019-10-17 DIAGNOSIS — Z20822 Contact with and (suspected) exposure to covid-19: Secondary | ICD-10-CM

## 2019-10-18 LAB — NOVEL CORONAVIRUS, NAA: SARS-CoV-2, NAA: NOT DETECTED

## 2019-11-24 ENCOUNTER — Other Ambulatory Visit: Payer: Self-pay

## 2019-11-24 DIAGNOSIS — Z20822 Contact with and (suspected) exposure to covid-19: Secondary | ICD-10-CM

## 2019-11-26 LAB — NOVEL CORONAVIRUS, NAA: SARS-CoV-2, NAA: NOT DETECTED

## 2020-03-07 ENCOUNTER — Ambulatory Visit: Payer: PRIVATE HEALTH INSURANCE | Attending: Internal Medicine

## 2020-03-07 DIAGNOSIS — Z20822 Contact with and (suspected) exposure to covid-19: Secondary | ICD-10-CM

## 2020-03-08 LAB — SARS-COV-2, NAA 2 DAY TAT

## 2020-03-08 LAB — NOVEL CORONAVIRUS, NAA: SARS-CoV-2, NAA: NOT DETECTED

## 2020-03-12 ENCOUNTER — Ambulatory Visit: Payer: PRIVATE HEALTH INSURANCE | Attending: Internal Medicine

## 2020-03-12 DIAGNOSIS — Z20822 Contact with and (suspected) exposure to covid-19: Secondary | ICD-10-CM

## 2020-03-13 LAB — SARS-COV-2, NAA 2 DAY TAT

## 2020-03-13 LAB — NOVEL CORONAVIRUS, NAA: SARS-CoV-2, NAA: NOT DETECTED

## 2020-04-25 ENCOUNTER — Encounter: Payer: PRIVATE HEALTH INSURANCE | Admitting: Nurse Practitioner

## 2020-04-30 ENCOUNTER — Other Ambulatory Visit: Payer: Self-pay

## 2020-04-30 ENCOUNTER — Encounter: Payer: Self-pay | Admitting: Nurse Practitioner

## 2020-04-30 ENCOUNTER — Ambulatory Visit (INDEPENDENT_AMBULATORY_CARE_PROVIDER_SITE_OTHER): Payer: PRIVATE HEALTH INSURANCE | Admitting: Nurse Practitioner

## 2020-04-30 VITALS — BP 136/86 | HR 74 | Temp 97.6°F | Ht 63.3 in | Wt 280.8 lb

## 2020-04-30 DIAGNOSIS — R739 Hyperglycemia, unspecified: Secondary | ICD-10-CM | POA: Diagnosis not present

## 2020-04-30 DIAGNOSIS — I10 Essential (primary) hypertension: Secondary | ICD-10-CM | POA: Diagnosis not present

## 2020-04-30 DIAGNOSIS — Z6841 Body Mass Index (BMI) 40.0 and over, adult: Secondary | ICD-10-CM

## 2020-04-30 DIAGNOSIS — Z1231 Encounter for screening mammogram for malignant neoplasm of breast: Secondary | ICD-10-CM

## 2020-04-30 DIAGNOSIS — D75839 Thrombocytosis, unspecified: Secondary | ICD-10-CM

## 2020-04-30 DIAGNOSIS — E781 Pure hyperglyceridemia: Secondary | ICD-10-CM | POA: Diagnosis not present

## 2020-04-30 DIAGNOSIS — D473 Essential (hemorrhagic) thrombocythemia: Secondary | ICD-10-CM

## 2020-04-30 DIAGNOSIS — Z1211 Encounter for screening for malignant neoplasm of colon: Secondary | ICD-10-CM

## 2020-04-30 LAB — CBC WITH DIFFERENTIAL/PLATELET
Basophils Absolute: 0 10*3/uL (ref 0.0–0.1)
Basophils Relative: 0.4 % (ref 0.0–3.0)
Eosinophils Absolute: 0.3 10*3/uL (ref 0.0–0.7)
Eosinophils Relative: 2.3 % (ref 0.0–5.0)
HCT: 37.3 % (ref 36.0–46.0)
Hemoglobin: 12.2 g/dL (ref 12.0–15.0)
Lymphocytes Relative: 19.8 % (ref 12.0–46.0)
Lymphs Abs: 2.2 10*3/uL (ref 0.7–4.0)
MCHC: 32.6 g/dL (ref 30.0–36.0)
MCV: 91.1 fl (ref 78.0–100.0)
Monocytes Absolute: 0.5 10*3/uL (ref 0.1–1.0)
Monocytes Relative: 4.4 % (ref 3.0–12.0)
Neutro Abs: 7.9 10*3/uL — ABNORMAL HIGH (ref 1.4–7.7)
Neutrophils Relative %: 73.1 % (ref 43.0–77.0)
Platelets: 505 10*3/uL — ABNORMAL HIGH (ref 150.0–400.0)
RBC: 4.1 Mil/uL (ref 3.87–5.11)
RDW: 14.6 % (ref 11.5–15.5)
WBC: 10.9 10*3/uL — ABNORMAL HIGH (ref 4.0–10.5)

## 2020-04-30 LAB — COMPREHENSIVE METABOLIC PANEL
ALT: 20 U/L (ref 0–35)
AST: 14 U/L (ref 0–37)
Albumin: 3.9 g/dL (ref 3.5–5.2)
Alkaline Phosphatase: 76 U/L (ref 39–117)
BUN: 7 mg/dL (ref 6–23)
CO2: 27 mEq/L (ref 19–32)
Calcium: 9.2 mg/dL (ref 8.4–10.5)
Chloride: 101 mEq/L (ref 96–112)
Creatinine, Ser: 0.58 mg/dL (ref 0.40–1.20)
GFR: 108.56 mL/min (ref >60.00)
Glucose, Bld: 116 mg/dL — ABNORMAL HIGH (ref 70–99)
Potassium: 4.1 mEq/L (ref 3.5–5.1)
Sodium: 136 mEq/L (ref 135–145)
Total Bilirubin: 0.3 mg/dL (ref 0.2–1.2)
Total Protein: 7 g/dL (ref 6.0–8.3)

## 2020-04-30 LAB — LIPID PANEL
Cholesterol: 153 mg/dL (ref 0–200)
HDL: 58.7 mg/dL (ref >39.00)
LDL Cholesterol: 65 mg/dL (ref 0–99)
NonHDL: 94.07
Total CHOL/HDL Ratio: 3
Triglycerides: 143 mg/dL (ref 0.0–149.0)
VLDL: 28.6 mg/dL (ref 0.0–40.0)

## 2020-04-30 LAB — HEMOGLOBIN A1C: Hgb A1c MFr Bld: 5.7 % (ref 4.6–6.5)

## 2020-04-30 NOTE — Progress Notes (Signed)
Subjective:  Patient ID: Diane Sloan, female    DOB: January 26, 1966  Age: 54 y.o. MRN: [id number]  CC: Follow-up (pt is fasting//no OBGYN an pap today//no HIV//no colonoscopy and mammogram//refills phentermine and famotidine)  HPI  HTN: BP at goal with lisinopril BP Readings from Last 3 Encounters:  04/30/20 136/86  07/05/19 132/78  06/15/19 120/76    Obesity: BMI of 49 Hx of hyperglycemia with HgbA1c of 5.7 Normal lipid panel No change in diet, nor regular exercise regimen She wishes to resume phentermine. Last filled 15mg  06/2019. She did not f/up as recommended, so not sure if she lost any weight. She declined referral to weight loss clinic. No hx of CAD/atherosclerosis or arrhythmia or seizure or PE/DVT or constipation , no FHx of GAD or sudden MI.   Reviewed past Medical, Social and Family history today.  Outpatient Medications Prior to Visit  Medication Sig Dispense Refill  . diphenhydrAMINE (BENADRYL) 25 MG tablet Take 1 tablet (25 mg total) by mouth every 8 (eight) hours as needed for itching or allergies. 30 tablet 0  . EPINEPHrine (EPIPEN 2-PAK) 0.3 mg/0.3 mL IJ SOAJ injection Inject 0.3 mLs (0.3 mg total) into the muscle as needed for anaphylaxis. 1 Device 0  . famotidine (PEPCID) 20 MG tablet Take 1 tablet (20 mg total) by mouth 2 (two) times daily. 14 tablet 0  . Omega 3 1000 MG CAPS Take 2 capsules (2,000 mg total) by mouth 2 (two) times daily. 120 each 3  . PARoxetine (PAXIL) 20 MG tablet Take 1 tablet (20 mg total) by mouth every morning. 90 tablet 1  . lisinopril (PRINIVIL,ZESTRIL) 5 MG tablet Take 1 tablet (5 mg total) by mouth daily. For blood pressure. 90 tablet 1  . phentermine 15 MG capsule Take 1 capsule (15 mg total) by mouth every morning. 30 capsule 0  . predniSONE (STERAPRED UNI-PAK 21 TAB) 10 MG (21) TBPK tablet As directed on package 21 tablet 0   No facility-administered medications prior to visit.    ROS See HPI  Objective:  BP 136/86    Pulse 74   Temp 97.6 F (36.4 C) (Tympanic)   Ht 5' 3.3" (1.608 m)   Wt 280 lb 12.8 oz (127.4 kg)   SpO2 98%   BMI 49.27 kg/m   BP Readings from Last 3 Encounters:  04/30/20 136/86  07/05/19 132/78  06/15/19 120/76    Wt Readings from Last 3 Encounters:  04/30/20 280 lb 12.8 oz (127.4 kg)  07/05/19 277 lb 9.6 oz (125.9 kg)  06/15/19 284 lb (128.8 kg)    Physical Exam Vitals reviewed.  Constitutional:      Appearance: She is obese.  Cardiovascular:     Rate and Rhythm: Normal rate and regular rhythm.     Pulses: Normal pulses.     Heart sounds: Normal heart sounds.  Pulmonary:     Effort: Pulmonary effort is normal.     Breath sounds: Normal breath sounds.  Abdominal:     General: Bowel sounds are normal.     Palpations: Abdomen is soft.  Musculoskeletal:     Right lower leg: No edema.     Left lower leg: No edema.  Neurological:     Mental Status: She is alert and oriented to person, place, and time.  Psychiatric:        Mood and Affect: Mood normal.        Behavior: Behavior normal.        Thought Content: Thought content  normal.     Lab Results  Component Value Date   WBC 10.9 (H) 04/30/2020   HGB 12.2 04/30/2020   HCT 37.3 04/30/2020   PLT 505.0 (H) 04/30/2020   GLUCOSE 116 (H) 04/30/2020   CHOL 153 04/30/2020   TRIG 143.0 04/30/2020   HDL 58.70 04/30/2020   LDLDIRECT 86.0 10/30/2016   LDLCALC 65 04/30/2020   ALT 20 04/30/2020   AST 14 04/30/2020   NA 136 04/30/2020   K 4.1 04/30/2020   CL 101 04/30/2020   CREATININE 0.58 04/30/2020   BUN 7 04/30/2020   CO2 27 04/30/2020   TSH 1.32 04/30/2020   HGBA1C 5.7 04/30/2020   Unable to complete ECG today due to poor lead connection  Assessment & Plan:  This visit occurred during the SARS-CoV-2 public health emergency.  Safety protocols were in place, including screening questions prior to the visit, additional usage of staff PPE, and extensive cleaning of exam room while observing appropriate  contact time as indicated for disinfecting solutions.   Diane Sloan was seen today for follow-up.  Diagnoses and all orders for this visit:  Class 3 severe obesity due to excess calories with serious comorbidity and body mass index (BMI) of 45.0 to 49.9 in adult ([facility]) -     EKG 12-Lead -     Comprehensive metabolic panel -     Lipid panel -     Vitamin D 1,25 dihydroxy -     TSH -     phentermine 30 MG capsule; Take 1 capsule (30 mg total) by mouth every morning.  Hyperglycemia -     Comprehensive metabolic panel -     Hemoglobin A1c  Hypertriglyceridemia -     Comprehensive metabolic panel -     Lipid panel -     TSH  Essential hypertension, benign -     EKG 12-Lead -     Comprehensive metabolic panel -     TSH  Thrombocytosis ([facility]) -     CBC with Differential/Platelet  Colon cancer screening -     Ambulatory referral to Gastroenterology  Encounter for screening mammogram for malignant neoplasm of breast -     MM DIGITAL SCREENING BILATERAL; Future   I have discontinued Vestal Bitner's predniSONE and phentermine. I am also having her start on phentermine. Additionally, I am having her maintain her Omega 3, famotidine, diphenhydrAMINE, EPINEPHrine, and PARoxetine.  Meds ordered this encounter  Medications  . phentermine 30 MG capsule    Sig: Take 1 capsule (30 mg total) by mouth every morning.    Dispense:  30 capsule    Refill:  0    Order Specific Question:   Supervising Provider    Answer:   Ronnald Nian [id number]    Problem List Items Addressed This Visit      Cardiovascular and Mediastinum   Essential hypertension, benign   Relevant Orders   EKG 12-Lead (Completed)   Comprehensive metabolic panel (Completed)   TSH (Completed)     Hematopoietic and Hemostatic   Thrombocytosis (HCC)   Relevant Orders   CBC with Differential/Platelet (Completed)     Other   Class 3 severe obesity due to excess calories with serious comorbidity and body mass  index (BMI) of 45.0 to 49.9 in adult [facility]) - Primary   Relevant Medications   phentermine 30 MG capsule   Other Relevant Orders   EKG 12-Lead (Completed)   Comprehensive metabolic panel (Completed)   Lipid panel (Completed)  Vitamin D 1,25 dihydroxy   TSH (Completed)   Hyperglycemia   Relevant Orders   Comprehensive metabolic panel (Completed)   Hemoglobin A1c (Completed)   Hypertriglyceridemia   Relevant Orders   Comprehensive metabolic panel (Completed)   Lipid panel (Completed)   TSH (Completed)    Other Visit Diagnoses    Colon cancer screening       Relevant Orders   Ambulatory referral to Gastroenterology   Encounter for screening mammogram for malignant neoplasm of breast       Relevant Orders   MM DIGITAL SCREENING BILATERAL       Follow-up: Return in about 4 weeks (around 05/28/2020) for weight management (F2F, complete ECG).  Wilfred Lacy, NP

## 2020-04-30 NOTE — Patient Instructions (Addendum)
Normal TSH, renal and liver function, lipid panel. HgbA1c and glucose indicates prediabetes. Cbc indicates persistent elevated platelet count, but stable. Phentermine sent  You will be contacted to schedule appt with GI and for mammogram Schedule appt with GYN for pelvic exam.  It is essential to maintain DASH diet, daily exercise and small portions to promote weight loss.   Calorie Counting for Weight Loss Calories are units of energy. Your body needs a certain amount of calories from food to keep you going throughout the day. When you eat more calories than your body needs, your body stores the extra calories as fat. When you eat fewer calories than your body needs, your body burns fat to get the energy it needs. Calorie counting means keeping track of how many calories you eat and drink each day. Calorie counting can be helpful if you need to lose weight. If you make sure to eat fewer calories than your body needs, you should lose weight. Ask your health care provider what a healthy weight is for you. For calorie counting to work, you will need to eat the right number of calories in a day in order to lose a healthy amount of weight per week. A dietitian can help you determine how many calories you need in a day and will give you suggestions on how to reach your calorie goal.  A healthy amount of weight to lose per week is usually 1-2 lb (0.5-0.9 kg). This usually means that your daily calorie intake should be reduced by 500-750 calories.  Eating 1,200 - 1,500 calories per day can help most women lose weight.  Eating 1,500 - 1,800 calories per day can help most men lose weight. What is my plan? My goal is to have __________ calories per day. If I have this many calories per day, I should lose around __________ pounds per week. What do I need to know about calorie counting? In order to meet your daily calorie goal, you will need to:  Find out how many calories are in each food you would  like to eat. Try to do this before you eat.  Decide how much of the food you plan to eat.  Write down what you ate and how many calories it had. Doing this is called keeping a food log. To successfully lose weight, it is important to balance calorie counting with a healthy lifestyle that includes regular activity. Aim for 150 minutes of moderate exercise (such as walking) or 75 minutes of vigorous exercise (such as running) each week. Where do I find calorie information?  The number of calories in a food can be found on a Nutrition Facts label. If a food does not have a Nutrition Facts label, try to look up the calories online or ask your dietitian for help. Remember that calories are listed per serving. If you choose to have more than one serving of a food, you will have to multiply the calories per serving by the amount of servings you plan to eat. For example, the label on a package of bread might say that a serving size is 1 slice and that there are 90 calories in a serving. If you eat 1 slice, you will have eaten 90 calories. If you eat 2 slices, you will have eaten 180 calories. How do I keep a food log? Immediately after each meal, record the following information in your food log:  What you ate. Don't forget to include toppings, sauces, and other extras on  the food.  How much you ate. This can be measured in cups, ounces, or number of items.  How many calories each food and drink had.  The total number of calories in the meal. Keep your food log near you, such as in a small notebook in your pocket, or use a mobile app or website. Some programs will calculate calories for you and show you how many calories you have left for the day to meet your goal. What are some calorie counting tips?   Use your calories on foods and drinks that will fill you up and not leave you hungry: ? Some examples of foods that fill you up are nuts and nut butters, vegetables, lean proteins, and high-fiber  foods like whole grains. High-fiber foods are foods with more than 5 g fiber per serving. ? Drinks such as sodas, specialty coffee drinks, alcohol, and juices have a lot of calories, yet do not fill you up.  Eat nutritious foods and avoid empty calories. Empty calories are calories you get from foods or beverages that do not have many vitamins or protein, such as candy, sweets, and soda. It is better to have a nutritious high-calorie food (such as an avocado) than a food with few nutrients (such as a bag of chips).  Know how many calories are in the foods you eat most often. This will help you calculate calorie counts faster.  Pay attention to calories in drinks. Low-calorie drinks include water and unsweetened drinks.  Pay attention to nutrition labels for "low fat" or "fat free" foods. These foods sometimes have the same amount of calories or more calories than the full fat versions. They also often have added sugar, starch, or salt, to make up for flavor that was removed with the fat.  Find a way of tracking calories that works for you. Get creative. Try different apps or programs if writing down calories does not work for you. What are some portion control tips?  Know how many calories are in a serving. This will help you know how many servings of a certain food you can have.  Use a measuring cup to measure serving sizes. You could also try weighing out portions on a kitchen scale. With time, you will be able to estimate serving sizes for some foods.  Take some time to put servings of different foods on your favorite plates, bowls, and cups so you know what a serving looks like.  Try not to eat straight from a bag or box. Doing this can lead to overeating. Put the amount you would like to eat in a cup or on a plate to make sure you are eating the right portion.  Use smaller plates, glasses, and bowls to prevent overeating.  Try not to multitask (for example, watch TV or use your computer)  while eating. If it is time to eat, sit down at a table and enjoy your food. This will help you to know when you are full. It will also help you to be aware of what you are eating and how much you are eating. What are tips for following this plan? Reading food labels  Check the calorie count compared to the serving size. The serving size may be smaller than what you are used to eating.  Check the source of the calories. Make sure the food you are eating is high in vitamins and protein and low in saturated and trans fats. Shopping  Read nutrition labels while you shop.  This will help you make healthy decisions before you decide to purchase your food.  Make a grocery list and stick to it. Cooking  Try to cook your favorite foods in a healthier way. For example, try baking instead of frying.  Use low-fat dairy products. Meal planning  Use more fruits and vegetables. Half of your plate should be fruits and vegetables.  Include lean proteins like poultry and fish. How do I count calories when eating out?  Ask for smaller portion sizes.  Consider sharing an entree and sides instead of getting your own entree.  If you get your own entree, eat only half. Ask for a box at the beginning of your meal and put the rest of your entree in it so you are not tempted to eat it.  If calories are listed on the menu, choose the lower calorie options.  Choose dishes that include vegetables, fruits, whole grains, low-fat dairy products, and lean protein.  Choose items that are boiled, broiled, grilled, or steamed. Stay away from items that are buttered, battered, fried, or served with cream sauce. Items labeled "crispy" are usually fried, unless stated otherwise.  Choose water, low-fat milk, unsweetened iced tea, or other drinks without added sugar. If you want an alcoholic beverage, choose a lower calorie option such as a glass of wine or light beer.  Ask for dressings, sauces, and syrups on the  side. These are usually high in calories, so you should limit the amount you eat.  If you want a salad, choose a garden salad and ask for grilled meats. Avoid extra toppings like bacon, cheese, or fried items. Ask for the dressing on the side, or ask for olive oil and vinegar or lemon to use as dressing.  Estimate how many servings of a food you are given. For example, a serving of cooked rice is  cup or about the size of half a baseball. Knowing serving sizes will help you be aware of how much food you are eating at restaurants. The list below tells you how big or small some common portion sizes are based on everyday objects: ? 1 oz--4 stacked dice. ? 3 oz--1 deck of cards. ? 1 tsp--1 die. ? 1 Tbsp-- a ping-pong ball. ? 2 Tbsp--1 ping-pong ball. ?  cup-- baseball. ? 1 cup--1 baseball. Summary  Calorie counting means keeping track of how many calories you eat and drink each day. If you eat fewer calories than your body needs, you should lose weight.  A healthy amount of weight to lose per week is usually 1-2 lb (0.5-0.9 kg). This usually means reducing your daily calorie intake by 500-750 calories.  The number of calories in a food can be found on a Nutrition Facts label. If a food does not have a Nutrition Facts label, try to look up the calories online or ask your dietitian for help.  Use your calories on foods and drinks that will fill you up, and not on foods and drinks that will leave you hungry.  Use smaller plates, glasses, and bowls to prevent overeating. This information is not intended to replace advice given to you by your health care provider. Make sure you discuss any questions you have with your health care provider. Document Revised: 08/20/2018 Document Reviewed: 10/31/2016 Elsevier Patient Education  2020 [employer].   Exercising to Lose Weight Exercise is structured, repetitive physical activity to improve fitness and health. Getting regular exercise is important  for everyone. It is especially important if  you are overweight. Being overweight increases your risk of heart disease, stroke, diabetes, high blood pressure, and several types of cancer. Reducing your calorie intake and exercising can help you lose weight. Exercise is usually categorized as moderate or vigorous intensity. To lose weight, most people need to do a certain amount of moderate-intensity or vigorous-intensity exercise each week. Moderate-intensity exercise  Moderate-intensity exercise is any activity that gets you moving enough to burn at least three times more energy (calories) than if you were sitting. Examples of moderate exercise include:  Walking a mile in 15 minutes.  Doing light yard work.  Biking at an easy pace. Most people should get at least 150 minutes (2 hours and 30 minutes) a week of moderate-intensity exercise to maintain their body weight. Vigorous-intensity exercise Vigorous-intensity exercise is any activity that gets you moving enough to burn at least six times more calories than if you were sitting. When you exercise at this intensity, you should be working hard enough that you are not able to carry on a conversation. Examples of vigorous exercise include:  Running.  Playing a team sport, such as football, basketball, and soccer.  Jumping rope. Most people should get at least 75 minutes (1 hour and 15 minutes) a week of vigorous-intensity exercise to maintain their body weight. How can exercise affect me? When you exercise enough to burn more calories than you eat, you lose weight. Exercise also reduces body fat and builds muscle. The more muscle you have, the more calories you burn. Exercise also:  Improves mood.  Reduces stress and tension.  Improves your overall fitness, flexibility, and endurance.  Increases bone strength. The amount of exercise you need to lose weight depends on:  Your age.  The type of exercise.  Any health conditions you  have.  Your overall physical ability. Talk to your health care provider about how much exercise you need and what types of activities are safe for you. What actions can I take to lose weight? Nutrition   Make changes to your diet as told by your health care provider or diet and nutrition specialist (dietitian). This may include: ? Eating fewer calories. ? Eating more protein. ? Eating less unhealthy fats. ? Eating a diet that includes fresh fruits and vegetables, whole grains, low-fat dairy products, and lean protein. ? Avoiding foods with added fat, salt, and sugar.  Drink plenty of water while you exercise to prevent dehydration or heat stroke. Activity  Choose an activity that you enjoy and set realistic goals. Your health care provider can help you make an exercise plan that works for you.  Exercise at a moderate or vigorous intensity most days of the week. ? The intensity of exercise may vary from person to person. You can tell how intense a workout is for you by paying attention to your breathing and heartbeat. Most people will notice their breathing and heartbeat get faster with more intense exercise.  Do resistance training twice each week, such as: ? Push-ups. ? Sit-ups. ? Lifting weights. ? Using resistance bands.  Getting short amounts of exercise can be just as helpful as long structured periods of exercise. If you have trouble finding time to exercise, try to include exercise in your daily routine. ? Get up, stretch, and walk around every 30 minutes throughout the day. ? Go for a walk during your lunch break. ? Park your car farther away from your destination. ? If you take public transportation, get off one stop early and walk  the rest of the way. ? Make phone calls while standing up and walking around. ? Take the stairs instead of elevators or escalators.  Wear comfortable clothes and shoes with good support.  Do not exercise so much that you hurt yourself, feel  dizzy, or get very short of breath. Where to find more information  [place] Department of Health and Human Services: [employer]at  Centers for Disease Control and Prevention (CDC): http://www.wolf.info/ Contact a health care provider:  Before starting a new exercise program.  If you have questions or concerns about your weight.  If you have a medical problem that keeps you from exercising. Get help right away if you have any of the following while exercising:  Injury.  Dizziness.  Difficulty breathing or shortness of breath that does not go away when you stop exercising.  Chest pain.  Rapid heartbeat. Summary  Being overweight increases your risk of heart disease, stroke, diabetes, high blood pressure, and several types of cancer.  Losing weight happens when you burn more calories than you eat.  Reducing the amount of calories you eat in addition to getting regular moderate or vigorous exercise each week helps you lose weight. This information is not intended to replace advice given to you by your health care provider. Make sure you discuss any questions you have with your health care provider. Document Revised: 12/14/2017 Document Reviewed: 12/14/2017 Elsevier Patient Education  2020 [employer].

## 2020-05-01 ENCOUNTER — Encounter: Payer: Self-pay | Admitting: Nurse Practitioner

## 2020-05-01 ENCOUNTER — Telehealth: Payer: Self-pay | Admitting: Nurse Practitioner

## 2020-05-01 DIAGNOSIS — Z0001 Encounter for general adult medical examination with abnormal findings: Secondary | ICD-10-CM

## 2020-05-01 DIAGNOSIS — I10 Essential (primary) hypertension: Secondary | ICD-10-CM

## 2020-05-01 LAB — TSH: TSH: 1.32 u[IU]/mL (ref 0.35–4.50)

## 2020-05-01 MED ORDER — PHENTERMINE HCL 30 MG PO CAPS: 30.0000 mg | ORAL_CAPSULE | ORAL | 0 refills | Status: DC

## 2020-05-01 MED ORDER — LISINOPRIL 5 MG PO TABS: 5.0000 mg | ORAL_TABLET | Freq: Every day | ORAL | 1 refills | Status: DC

## 2020-05-01 NOTE — Telephone Encounter (Signed)
Rx sent and pt notified.

## 2020-05-01 NOTE — Telephone Encounter (Signed)
Patient is calling and requesting a refill for lisinopril sent to [facility] on [address]. CB is [phone]

## 2020-05-09 ENCOUNTER — Telehealth: Payer: Self-pay | Admitting: Nurse Practitioner

## 2020-05-09 NOTE — Telephone Encounter (Signed)
error 

## 2020-05-28 ENCOUNTER — Ambulatory Visit: Payer: PRIVATE HEALTH INSURANCE | Admitting: Nurse Practitioner

## 2020-05-28 DIAGNOSIS — Z0289 Encounter for other administrative examinations: Secondary | ICD-10-CM

## 2020-06-13 ENCOUNTER — Telehealth: Payer: Self-pay | Admitting: Nurse Practitioner

## 2020-06-13 ENCOUNTER — Encounter: Payer: Self-pay | Admitting: Nurse Practitioner

## 2020-06-13 NOTE — Telephone Encounter (Signed)
D °Thank you °

## 2020-06-13 NOTE — Telephone Encounter (Signed)
Pt was no show for appt 05/28/2020. Letter has been mailed to pt indicating 2nd no show w/in past 12 months.   PCP,  Please reply back with corresponding letter matching appropriate follow up needs.  A - No follow up necessary B - Follow up urgent - locate patient immediately to schedule appointment. C - Follow up necessary. Contact patient and schedule visit w/in 7 days. D - Follow up necessary. Contact patient and schedule visit w/in 2-4 weeks.

## 2020-06-14 NOTE — Telephone Encounter (Signed)
Please call to schedule 2-4 week f/u per PCP request.

## 2020-06-15 NOTE — Telephone Encounter (Signed)
Called patient regarding missed appointment on 05/28/2020. LVM to give the office a call back to reschedule.

## 2020-09-24 ENCOUNTER — Other Ambulatory Visit: Payer: Self-pay

## 2020-09-24 ENCOUNTER — Telehealth: Payer: Self-pay | Admitting: Nurse Practitioner

## 2020-09-24 ENCOUNTER — Other Ambulatory Visit: Payer: PRIVATE HEALTH INSURANCE

## 2020-09-24 DIAGNOSIS — Z20822 Contact with and (suspected) exposure to covid-19: Secondary | ICD-10-CM

## 2020-09-24 NOTE — Telephone Encounter (Signed)
Patient sent MyChart message requesting to be seen for Right Side Lower Quadrant Pain. Called and left message to give the office a call back to schedule.

## 2020-09-25 ENCOUNTER — Ambulatory Visit: Payer: PRIVATE HEALTH INSURANCE | Admitting: Nurse Practitioner

## 2020-09-25 LAB — NOVEL CORONAVIRUS, NAA: SARS-CoV-2, NAA: NOT DETECTED

## 2020-09-25 LAB — SARS-COV-2, NAA 2 DAY TAT

## 2020-09-26 ENCOUNTER — Other Ambulatory Visit: Payer: PRIVATE HEALTH INSURANCE

## 2020-09-26 ENCOUNTER — Encounter: Payer: Self-pay | Admitting: Nurse Practitioner

## 2020-09-26 ENCOUNTER — Telehealth (INDEPENDENT_AMBULATORY_CARE_PROVIDER_SITE_OTHER): Payer: PRIVATE HEALTH INSURANCE | Admitting: Nurse Practitioner

## 2020-09-26 ENCOUNTER — Other Ambulatory Visit: Payer: Self-pay

## 2020-09-26 VITALS — Ht 62.0 in | Wt 266.0 lb

## 2020-09-26 DIAGNOSIS — G8929 Other chronic pain: Secondary | ICD-10-CM | POA: Insufficient documentation

## 2020-09-26 DIAGNOSIS — M25561 Pain in right knee: Secondary | ICD-10-CM | POA: Diagnosis not present

## 2020-09-26 DIAGNOSIS — R1031 Right lower quadrant pain: Secondary | ICD-10-CM

## 2020-09-26 DIAGNOSIS — M25562 Pain in left knee: Secondary | ICD-10-CM | POA: Diagnosis not present

## 2020-09-26 LAB — CBC WITH DIFFERENTIAL/PLATELET
Basophils Absolute: 0 10*3/uL (ref 0.0–0.1)
Basophils Relative: 0.4 % (ref 0.0–3.0)
Eosinophils Absolute: 0.3 10*3/uL (ref 0.0–0.7)
Eosinophils Relative: 2.7 % (ref 0.0–5.0)
HCT: 38 % (ref 36.0–46.0)
Hemoglobin: 12.4 g/dL (ref 12.0–15.0)
Lymphocytes Relative: 19.3 % (ref 12.0–46.0)
Lymphs Abs: 2.2 10*3/uL (ref 0.7–4.0)
MCHC: 32.7 g/dL (ref 30.0–36.0)
MCV: 89.5 fl (ref 78.0–100.0)
Monocytes Absolute: 0.6 10*3/uL (ref 0.1–1.0)
Monocytes Relative: 5.4 % (ref 3.0–12.0)
Neutro Abs: 8.3 10*3/uL — ABNORMAL HIGH (ref 1.4–7.7)
Neutrophils Relative %: 72.2 % (ref 43.0–77.0)
Platelets: 531 10*3/uL — ABNORMAL HIGH (ref 150.0–400.0)
RBC: 4.25 Mil/uL (ref 3.87–5.11)
RDW: 14.2 % (ref 11.5–15.5)
WBC: 11.5 10*3/uL — ABNORMAL HIGH (ref 4.0–10.5)

## 2020-09-26 LAB — C-REACTIVE PROTEIN: CRP: 3.3 mg/dL (ref 0.5–20.0)

## 2020-09-26 MED ORDER — MELOXICAM 7.5 MG PO TABS: 7.5000 mg | ORAL_TABLET | Freq: Every day | ORAL | 0 refills | Status: AC

## 2020-09-26 NOTE — Progress Notes (Signed)
Virtual Visit via Video Note  I connected with@ on 09/26/20 at  1:30 PM EDT by a video enabled telemedicine application and verified that I am speaking with the correct person using two identifiers.  Location: Patient:Home Provider: Office Participants: patient and provider  I discussed the limitations of evaluation and management by telemedicine and the availability of in person appointments. I also discussed with the patient that there may be a patient responsible charge related to this service. The patient expressed understanding and agreed to proceed.  CC:RLQ ABD pain and meloxicam refill  History of Present Illness: Abdominal Pain This is a new problem. The current episode started in the past 7 days. The onset quality is gradual. The problem occurs constantly. The problem has been unchanged. The pain is located in the RLQ. The quality of the pain is aching. The abdominal pain does not radiate. Associated symptoms include belching. Pertinent negatives include no anorexia, constipation, diarrhea, dysuria, fever, flatus, frequency, headaches, hematochezia, hematuria, melena, myalgias, nausea, vomiting or weight loss. The pain is aggravated by movement and palpation. The pain is relieved by nothing. She has tried acetaminophen and antacids for the symptoms. The treatment provided no relief. There is no history of abdominal surgery, gallstones, GERD, irritable bowel syndrome, pancreatitis, PUD or ulcerative colitis.  denies any vaginal or groin symptoms or rash or redness  Observations/Objective: Physical Exam Vitals reviewed.  Constitutional:      General: She is not in acute distress.    Appearance: She is obese.  Pulmonary:     Effort: Pulmonary effort is normal.  Neurological:     Mental Status: She is alert and oriented to person, place, and time.    Assessment and Plan: Diane Sloan was seen today for acute visit.  Diagnoses and all orders for this visit:  Continuous RLQ  abdominal pain -     Urinalysis w microscopic + reflex cultur -     CBC with Differential/Platelet -     C-reactive protein  Chronic pain of both knees -     meloxicam (MOBIC) 7.5 MG tablet; Take 1 tablet (7.5 mg total) by mouth daily. With food. Need OV for additional refills   Follow Up Instructions: Normal urinalysis, CRP and stable CBC Schedule appt for CPE.   I discussed the assessment and treatment plan with the patient. The patient was provided an opportunity to ask questions and all were answered. The patient agreed with the plan and demonstrated an understanding of the instructions.   The patient was advised to call back or seek an in-person evaluation if the symptoms worsen or if the condition fails to improve as anticipated.   Wilfred Lacy, NP

## 2020-09-26 NOTE — Patient Instructions (Signed)
Go to lab for blood draw and urine collection. Schedule appt for CPE

## 2020-09-27 LAB — URINALYSIS W MICROSCOPIC + REFLEX CULTURE
Bacteria, UA: NONE SEEN /HPF
Bilirubin Urine: NEGATIVE
Glucose, UA: NEGATIVE
Hgb urine dipstick: NEGATIVE
Hyaline Cast: NONE SEEN /LPF
Leukocyte Esterase: NEGATIVE
Nitrites, Initial: NEGATIVE
Specific Gravity, Urine: 1.041 — ABNORMAL HIGH (ref 1.001–1.03)
pH: 6.5 (ref 5.0–8.0)

## 2020-09-27 LAB — NO CULTURE INDICATED

## 2020-10-30 DIAGNOSIS — L910 Hypertrophic scar: Secondary | ICD-10-CM | POA: Insufficient documentation

## 2020-11-05 ENCOUNTER — Other Ambulatory Visit: Payer: Self-pay | Admitting: Nurse Practitioner

## 2020-11-12 ENCOUNTER — Other Ambulatory Visit: Payer: Self-pay | Admitting: Nurse Practitioner

## 2020-11-12 DIAGNOSIS — Z6841 Body Mass Index (BMI) 40.0 and over, adult: Secondary | ICD-10-CM

## 2020-11-12 NOTE — Telephone Encounter (Signed)
I do not see any BP documentation She needs to schedule an in person appt in order to get additional refills

## 2020-11-12 NOTE — Telephone Encounter (Signed)
Pt called to f/u on request as she said she did come in for BP check and labs the same day as her last video visit. Pt is out.

## 2020-11-12 NOTE — Telephone Encounter (Signed)
Pt notified and appointment has been scheduled.

## 2020-11-16 ENCOUNTER — Ambulatory Visit: Payer: PRIVATE HEALTH INSURANCE | Admitting: Nurse Practitioner

## 2021-02-06 ENCOUNTER — Other Ambulatory Visit: Payer: Self-pay

## 2021-02-07 ENCOUNTER — Encounter: Payer: Self-pay | Admitting: Nurse Practitioner

## 2021-02-07 ENCOUNTER — Ambulatory Visit (INDEPENDENT_AMBULATORY_CARE_PROVIDER_SITE_OTHER): Payer: PRIVATE HEALTH INSURANCE | Admitting: Nurse Practitioner

## 2021-02-07 VITALS — BP 128/80 | HR 96 | Temp 97.2°F | Ht 62.0 in | Wt 279.0 lb

## 2021-02-07 DIAGNOSIS — R739 Hyperglycemia, unspecified: Secondary | ICD-10-CM

## 2021-02-07 DIAGNOSIS — I10 Essential (primary) hypertension: Secondary | ICD-10-CM

## 2021-02-07 DIAGNOSIS — Z6841 Body Mass Index (BMI) 40.0 and over, adult: Secondary | ICD-10-CM

## 2021-02-07 MED ORDER — LISINOPRIL 5 MG PO TABS: 5.0000 mg | ORAL_TABLET | Freq: Every day | ORAL | 3 refills | Status: AC

## 2021-02-07 MED ORDER — PHENTERMINE HCL 15 MG PO CAPS: 15.0000 mg | ORAL_CAPSULE | ORAL | 0 refills | Status: AC

## 2021-02-07 NOTE — Progress Notes (Signed)
Subjective:  Patient ID: Diane Sloan, female    DOB: 1966-07-31  Age: 55 y.o. MRN: [id number]  CC: Medication Refill (Refill on Lisinopril an Phentermine, no concerns.)   HPI  Essential hypertension, benign BP at goal with lisinopril BP Readings from Last 3 Encounters:  02/07/21 128/80  04/30/20 136/86  07/05/19 132/78   Repeat BMP  Refill medication  Morbid obesity ([place]) Started exercise: 3x/week, walking on treadmill for 55mins No dietary changes at this time Does not want to consider referral to bariatric clinic at this time. Declined use of ozempic or saxenda injection. Wants to resume phentermine 15mg , last used 04/2020 x30days, no adverse reaction.  BP at goal with lisinopril Prediabetic with last HgbA1c of 5.7 She agreed to referral to weight management. Advised about possible adverse effects of phentermine: cardiovascular or cerebrovascular event or PE. She verbalized understanding. Repeat TSH BMP and HgbA1c today Phentermine 15mg  sent F/up in 6month   Wt Readings from Last 3 Encounters:  02/07/21 279 lb (126.6 kg)  09/26/20 266 lb (120.7 kg)  04/30/20 280 lb 12.8 oz (127.4 kg)    Reviewed past Medical, Social and Family history today.  Outpatient Medications Prior to Visit  Medication Sig Dispense Refill  . diphenhydrAMINE (BENADRYL) 25 MG tablet Take 1 tablet (25 mg total) by mouth every 8 (eight) hours as needed for itching or allergies. 30 tablet 0  . EPINEPHrine (EPIPEN 2-PAK) 0.3 mg/0.3 mL IJ SOAJ injection Inject 0.3 mLs (0.3 mg total) into the muscle as needed for anaphylaxis. 1 Device 0  . meloxicam (MOBIC) 7.5 MG tablet Take 1 tablet (7.5 mg total) by mouth daily. With food. Need OV for additional refills 30 tablet 0  . lisinopril (ZESTRIL) 5 MG tablet Take 1 tablet (5 mg total) by mouth daily. For blood pressure. 90 tablet 1  . famotidine (PEPCID) 20 MG tablet Take 1 tablet (20 mg total) by mouth 2 (two) times daily. (Patient not taking:  Reported on 02/07/2021) 14 tablet 0   No facility-administered medications prior to visit.    ROS See HPI  Objective:  BP 128/80 (BP Location: Left Arm, Cuff Size: Large)   Pulse 96   Temp (!) 97.2 F (36.2 C) (Temporal)   Ht 5\' 2"  (1.575 m)   Wt 279 lb (126.6 kg)   SpO2 98%   BMI 51.03 kg/m   Physical Exam Constitutional:      Appearance: She is obese.  Cardiovascular:     Rate and Rhythm: Normal rate and regular rhythm.     Pulses: Normal pulses.     Heart sounds: Normal heart sounds.  Pulmonary:     Effort: Pulmonary effort is normal.     Breath sounds: Normal breath sounds.  Musculoskeletal:     Right lower leg: No edema.     Left lower leg: No edema.  Neurological:     Mental Status: She is alert and oriented to person, place, and time.  Psychiatric:        Mood and Affect: Mood normal.        Behavior: Behavior normal.        Thought Content: Thought content normal.    Assessment & Plan:  This visit occurred during the SARS-CoV-2 public health emergency.  Safety protocols were in place, including screening questions prior to the visit, additional usage of staff PPE, and extensive cleaning of exam room while observing appropriate contact time as indicated for disinfecting solutions.   Diane Sloan was seen today for medication  refill.  Diagnoses and all orders for this visit:  Essential hypertension, benign -     Basic metabolic panel -     lisinopril (ZESTRIL) 5 MG tablet; Take 1 tablet (5 mg total) by mouth daily. For blood pressure.  Class 3 severe obesity due to excess calories with serious comorbidity and body mass index (BMI) of 50.0 to 59.9 in adult (HCC) -     Hemoglobin A1c -     TSH -     phentermine 15 MG capsule; Take 1 capsule (15 mg total) by mouth every morning. -     Amb Ref to Medical Weight Management  Hyperglycemia -     Hemoglobin A1c    Problem List Items Addressed This Visit      Cardiovascular and Mediastinum   Essential  hypertension, benign - Primary    BP at goal with lisinopril BP Readings from Last 3 Encounters:  02/07/21 128/80  04/30/20 136/86  07/05/19 132/78   Repeat BMP  Refill medication      Relevant Medications   lisinopril (ZESTRIL) 5 MG tablet   Other Relevant Orders   Basic metabolic panel     Other   Hyperglycemia   Relevant Orders   Hemoglobin A1c   Morbid obesity ([place])    Started exercise: 3x/week, walking on treadmill for 59mins No dietary changes at this time Does not want to consider referral to bariatric clinic at this time. Declined use of ozempic or saxenda injection. Wants to resume phentermine 15mg , last used 54/6270 x30days, no adverse reaction.  BP at goal with lisinopril Prediabetic with last HgbA1c of 5.7 She agreed to referral to weight management. Advised about possible adverse effects of phentermine: cardiovascular or cerebrovascular event or PE. She verbalized understanding. Repeat TSH BMP and HgbA1c today Phentermine 15mg  sent F/up in 108month       Relevant Medications   phentermine 15 MG capsule      Follow-up: Return in about 4 weeks (around 03/07/2021) for weight management and HTN.  Wilfred Lacy, NP

## 2021-02-07 NOTE — Patient Instructions (Signed)
Go to lab for blood draw. You will be contacted to schedule appt with weight management clinic F/up in 109month

## 2021-02-07 NOTE — Assessment & Plan Note (Addendum)
Started exercise: 3x/week, walking on treadmill for 58mins No dietary changes at this time Does not want to consider referral to bariatric clinic at this time. Declined use of ozempic or saxenda injection. Wants to resume phentermine 15mg , last used 04/2020 x30days, no adverse reaction.  BP at goal with lisinopril Prediabetic with last HgbA1c of 5.7 She agreed to referral to weight management. Advised about possible adverse effects of phentermine: cardiovascular or cerebrovascular event or PE. She verbalized understanding. Repeat TSH BMP and HgbA1c today Phentermine 15mg  sent F/up in 56month

## 2021-02-07 NOTE — Assessment & Plan Note (Signed)
BP at goal with lisinopril BP Readings from Last 3 Encounters:  02/07/21 128/80  04/30/20 136/86  07/05/19 132/78   Repeat BMP  Refill medication

## 2021-02-08 LAB — BASIC METABOLIC PANEL
BUN: 9 mg/dL (ref 6–23)
CO2: 26 mEq/L (ref 19–32)
Calcium: 9.2 mg/dL (ref 8.4–10.5)
Chloride: 105 mEq/L (ref 96–112)
Creatinine, Ser: 0.66 mg/dL (ref 0.40–1.20)
GFR: 99.59 mL/min (ref >60.00)
Glucose, Bld: 114 mg/dL — ABNORMAL HIGH (ref 70–99)
Potassium: 3.8 mEq/L (ref 3.5–5.1)
Sodium: 140 mEq/L (ref 135–145)

## 2021-02-08 LAB — HEMOGLOBIN A1C: Hgb A1c MFr Bld: 5.8 % (ref 4.6–6.5)

## 2021-02-08 LAB — TSH: TSH: 0.88 u[IU]/mL (ref 0.35–4.50)

## 2021-05-09 ENCOUNTER — Ambulatory Visit (INDEPENDENT_AMBULATORY_CARE_PROVIDER_SITE_OTHER): Payer: Self-pay | Admitting: Bariatrics

## 2021-05-23 ENCOUNTER — Ambulatory Visit (INDEPENDENT_AMBULATORY_CARE_PROVIDER_SITE_OTHER): Payer: Self-pay | Admitting: Bariatrics

## 2021-08-13 ENCOUNTER — Telehealth: Payer: Self-pay | Admitting: Nurse Practitioner

## 2021-08-13 NOTE — Telephone Encounter (Signed)
Pt is needing a pre-cert letter to authorize her bariatric surgery that she is wanting to schedule. She does have an upcoming appointment for this issue on 09/17/21. She just informed me she had made this appointment.

## 2021-09-05 ENCOUNTER — Encounter: Payer: Self-pay | Admitting: Family Medicine

## 2021-09-05 ENCOUNTER — Telehealth (INDEPENDENT_AMBULATORY_CARE_PROVIDER_SITE_OTHER): Payer: PRIVATE HEALTH INSURANCE | Admitting: Family Medicine

## 2021-09-05 DIAGNOSIS — U071 COVID-19: Secondary | ICD-10-CM | POA: Diagnosis not present

## 2021-09-05 MED ORDER — BENZONATATE 100 MG PO CAPS: 100.0000 mg | ORAL_CAPSULE | Freq: Two times a day (BID) | ORAL | 0 refills | Status: AC | PRN

## 2021-09-05 MED ORDER — MOLNUPIRAVIR EUA 200MG CAPSULE: 4.0000 | ORAL_CAPSULE | Freq: Two times a day (BID) | ORAL | 0 refills | Status: AC

## 2021-09-05 NOTE — Progress Notes (Signed)
Virtual Visit via Video Note  I connected with Diane Sloan  on 09/05/21 at 11:20 AM EDT by a video enabled telemedicine application and verified that I am speaking with the correct person using two identifiers.  Location patient: home, [place] Location provider:work or home office Persons participating in the virtual visit: patient, provider  I discussed the limitations of evaluation and management by telemedicine and the availability of in person appointments. The patient expressed understanding and agreed to proceed.   HPI:  Acute telemedicine visit for Covid19: -Onset: 3 days ago, test was positive at [facility] yesterday -Symptoms include: nasal congestion, cough, HA, body aches, diarrhea -Denies:fever, vomiting, CP, SOB, inability to eat/drink/get out of bed -Pertinent past medical history: see below and morbid obesity -Pertinent medication allergies: No Known Allergies -COVID-19 vaccine status:  2 doses and 1 booster -no labs recently  ROS: See pertinent positives and negatives per HPI.  Past Medical History:  Diagnosis Date   Anxiety    Hypertension     Past Surgical History:  Procedure Laterality Date   TUBAL LIGATION       Current Outpatient Medications:    lisinopril (ZESTRIL) 5 MG tablet, Take 1 tablet (5 mg total) by mouth daily. For blood pressure., Disp: 90 tablet, Rfl: 3   Multiple Vitamin (MULTIVITAMIN) tablet, Take 1 tablet by mouth daily., Disp: , Rfl:   Not Taking the phentermine currently - stopped about 1 week ago EXAM:  VITALS per patient if applicable:  GENERAL: alert, oriented, appears well and in no acute distress  HEENT: atraumatic, conjunttiva clear, no obvious abnormalities on inspection of external nose and ears  NECK: normal movements of the head and neck  LUNGS: on inspection no signs of respiratory distress, breathing rate appears normal, no obvious gross SOB, gasping or wheezing  CV: no obvious cyanosis  MS: moves all visible extremities  without noticeable abnormality  PSYCH/NEURO: pleasant and cooperative, no obvious depression or anxiety, speech and thought processing grossly intact  ASSESSMENT AND PLAN:  Discussed the following assessment and plan:  COVID-19   Discussed treatment options (infusions and oral options and risk of drug interactions), ideal treatment window, potential complications, isolation and precautions for COVID-19.  Discussed possibility of rebound with antivirals and the need to reisolate if it should occur for 5 days. Checked for/reviewed any labs done in the last 90 days with GFR listed in HPI if available. After lengthy discussion, the patient opted for treatment with molnupiravir due to being higher risk for complications of covid or severe disease and other factors. Discussed EUA status of this drug and the fact that there is preliminary limited knowledge of risks/interactions/side effects per EUA document vs possible benefits and precautions. This information was shared with patient during the visit and also was provided in patient instructions. Also, advised that patient discuss risks/interactions and use with pharmacist/treatment team as well. The patient did want a prescription for cough, Tessalon Rx sent.  Other symptomatic care measures summarized in patient instructions. Work/School slipped offered: provided in patient instructions   Advised to seek prompt in person care if worsening, new symptoms arise, or if is not improving with treatment. Discussed options for inperson care if PCP office not available. Did let this patient know that I only do telemedicine on Tuesdays and Thursdays for [place]. Advised to schedule follow up visit with PCP or [facility] if any further questions or concerns to avoid delays in care.   I discussed the assessment and treatment plan with the patient. The patient  was provided an opportunity to ask questions and all were answered. The patient agreed with the plan and demonstrated  an understanding of the instructions.     Lucretia Kern, DO

## 2021-09-05 NOTE — Patient Instructions (Signed)
---------------------------------------------------------------------------------------------------------------------------    WORK SLIP:  Patient Diane Sloan,  09-10-1966, was seen for a medical visit today, 09/05/21 . Please excuse from work for a COVID like illness. We advise 10 days minimum from the onset of symptoms (09/02/21) PLUS 1 day of no fever and improved symptoms. Will defer to employer for a sooner return to work if symptoms have resolved, it is greater than 5 days since the positive test and the patient can wear a high-quality, tight fitting mask such as N95 or KN95 at all times for an additional 5 days. Would also suggest COVID19 antigen testing is negative prior to return.  Sincerely: E-signature: Dr. Colin Benton, DO [facility] Ph: [phone]   ------------------------------------------------------------------------------------------------------------------------------    HOME CARE TIPS:   -I sent the medication(s) we discussed to your pharmacy: No orders of the defined types were placed in this encounter.    -I sent in the [place] treatment or referral you requested per our discussion. Please see the information provided below and discuss further with the pharmacist/treatment team.   -there is a chance of rebound illness after finishing your treatment. If you become sick again please isolate for an additional 5 days.    -can use tylenol if needed for fevers, aches and pains per instructions  -can use nasal saline a few times per day if you have nasal congestion  -stay hydrated, drink plenty of fluids and eat small healthy meals - avoid dairy  -follow up with your doctor in 2-3 days unless improving and feeling better  -stay home while sick, except to seek medical care. If you have COVID19, ideally it would be best to stay home for a full 10 days since the onset of symptoms PLUS one day of no fever and feeling better. Wear a  good mask that fits snugly (such as N95 or KN95) if around others to reduce the risk of transmission.  It was nice to meet you today, and I really hope you are feeling better soon. I help [place] out with telemedicine visits on Tuesdays and Thursdays and am available for visits on those days. If you have any concerns or questions following this visit please schedule a follow up visit with your Primary Care doctor or seek care at a local urgent care clinic to avoid delays in care.    Seek in person care or schedule a follow up video visit promptly if your symptoms worsen, new concerns arise or you are not improving with treatment. Call 911 and/or seek emergency care if your symptoms are severe or life threatening.    Fact Sheet for Patients And Caregivers Emergency Use Authorization (EUA) Of LAGEVRIOT (molnupiravir) capsules For Coronavirus Disease 2019 (COVID-19)  What is the most important information I should know about LAGEVRIO? LAGEVRIO may cause serious side effects, including: ? LAGEVRIO may cause harm to your unborn baby. It is not known if LAGEVRIO will harm your baby if you take LAGEVRIO during pregnancy. o LAGEVRIO is not recommended for use in pregnancy. o LAGEVRIO has not been studied in pregnancy. LAGEVRIO was studied in pregnant animals only. When LAGEVRIO was given to pregnant animals, LAGEVRIO caused harm to their unborn babies. o You and your healthcare provider may decide that you should take LAGEVRIO during pregnancy if there are no other COVID-19 treatment options approved or authorized by the FDA that are accessible or clinically appropriate for you. o If you and your healthcare provider decide that you should take [place]  during pregnancy, you and your healthcare provider should discuss the known and potential benefits and the potential risks of taking LAGEVRIO during pregnancy. For individuals who are able to become pregnant: ? You should use a reliable method of  birth control (contraception) consistently and correctly during treatment with LAGEVRIO and for 4 days after the last dose of LAGEVRIO. Talk to your healthcare provider about reliable birth control methods. ? Before starting treatment with [facility] your healthcare provider may do a pregnancy test to see if you are pregnant before starting treatment with LAGEVRIO. ? Tell your healthcare provider right away if you become pregnant or think you may be pregnant during treatment with LAGEVRIO. Pregnancy Surveillance Program: ? There is a pregnancy surveillance program for individuals who take LAGEVRIO during pregnancy. The purpose of this program is to collect information about the health of you and your baby. Talk to your healthcare provider about how to take part in this program. ? If you take LAGEVRIO during pregnancy and you agree to participate in the pregnancy surveillance program and allow your healthcare provider to share your information with [place], then your healthcare provider will report your use of [place] during pregnancy to [place]. by calling [phone] or [email or web address]. For individuals who are sexually active with partners who are able to become pregnant: ? It is not known if LAGEVRIO can affect sperm. While the risk is regarded as low, animal studies to fully assess the potential for LAGEVRIO to affect the babies of males treated with LAGEVRIO have not been completed. A reliable method of birth control (contraception) should be used consistently and correctly during treatment with LAGEVRIO and for at least 3 months after the last dose. The risk to sperm beyond 3 months is not known. Studies to understand the risk to sperm beyond 3 months are ongoing. Talk to your healthcare provider about reliable birth control methods. Talk to your healthcare provider if you have questions or concerns about how LAGEVRIO may affect sperm. You are  being given this fact sheet because your healthcare provider believes it is necessary to provide you with LAGEVRIO for the treatment of adults with mild-to-moderate coronavirus disease 2019 (COVID-19) with positive results of direct SARS-CoV-2 viral testing, and who are at high risk for progression to severe COVID-19 including hospitalization or death, and for whom other COVID-19 treatment options approved or authorized by the FDA are not accessible or clinically appropriate. The U.S. Food and Drug Administration (FDA) has issued an Emergency Use Authorization (EUA) to make LAGEVRIO available during the COVID-19 pandemic (for more details about an EUA please see "What is an Emergency Use Authorization?" at the end of this document). LAGEVRIO is not an FDA-approved medicine in the [place]. Read this Fact Sheet for information about LAGEVRIO. Talk to your healthcare provider about your options if you have any questions. It is your choice to take LAGEVRIO.  What is COVID-19? COVID-19 is caused by a virus called a coronavirus. You can get COVID-19 through close contact with another person who has the virus. COVID-19 illnesses have ranged from very mild-to-severe, including illness resulting in death. While information so far suggests that most COVID-19 illness is mild, serious illness can happen and may cause some of your other medical conditions to become worse. Older people and people of all ages with severe, long lasting (chronic) medical conditions like heart disease, lung disease and diabetes, for example seem to be at higher risk of being hospitalized for COVID-19.  What is LAGEVRIO? LAGEVRIO is an investigational medicine used to treat mild-to-moderate COVID-19 in adults: ? with positive results of direct SARS-CoV-2 viral testing, and ? who are at high risk for progression to severe COVID-19 including hospitalization or death, and for whom other COVID-19 treatment options approved  or authorized by the FDA are not accessible or clinically appropriate. The FDA has authorized the emergency use of LAGEVRIO for the treatment of mild-tomoderate COVID-19 in adults under an EUA. For more information on EUA, see the "What is an Emergency Use Authorization (EUA)?" section at the end of this Fact Sheet. LAGEVRIO is not authorized: ? for use in people less than 87 years of age. ? for prevention of COVID-19. ? for people needing hospitalization for COVID-19. ? for use for longer than 5 consecutive days.  What should I tell my healthcare provider before I take LAGEVRIO? Tell your healthcare provider if you: ? Have any allergies ? Are breastfeeding or plan to breastfeed ? Have any serious illnesses ? Are taking any medicines (prescription, over-the-counter, vitamins, or herbal products).  How do I take LAGEVRIO? ? Take LAGEVRIO exactly as your healthcare provider tells you to take it. ? Take 4 capsules of LAGEVRIO every 12 hours (for example, at 8 am and at 8 pm) ? Take LAGEVRIO for 5 days. It is important that you complete the full 5 days of treatment with LAGEVRIO. Do not stop taking LAGEVRIO before you complete the full 5 days of treatment, even if you feel better. ? Take LAGEVRIO with or without food. ? You should stay in isolation for as long as your healthcare provider tells you to. Talk to your healthcare provider if you are not sure about how to properly isolate while you have COVID-19. ? Swallow LAGEVRIO capsules whole. Do not open, break, or crush the capsules. If you cannot swallow capsules whole, tell your healthcare provider. ? What to do if you miss a dose: o If it has been less than 10 hours since the missed dose, take it as soon as you remember o If it has been more than 10 hours since the missed dose, skip the missed dose and take your dose at the next scheduled time. ? Do not double the dose of LAGEVRIO to make up for a missed dose.  What are the  important possible side effects of LAGEVRIO? ? See, "What is the most important information I should know about LAGEVRIO?" ? Allergic Reactions. Allergic reactions can happen in people taking LAGEVRIO, even after only 1 dose. Stop taking LAGEVRIO and call your healthcare provider right away if you get any of the following symptoms of an allergic reaction: o hives o rapid heartbeat o trouble swallowing or breathing o swelling of the mouth, lips, or face o throat tightness o hoarseness o skin rash The most common side effects of LAGEVRIO are: ? diarrhea ? nausea ? dizziness These are not all the possible side effects of LAGEVRIO. Not many people have taken LAGEVRIO. Serious and unexpected side effects may happen. This medicine is still being studied, so it is possible that all of the risks are not known at this time.  What other treatment choices are there?  Veklury (remdesivir) is FDA-approved as an intravenous (IV) infusion for the treatment of mildto-moderate GBTDV-76 in certain adults and children. Talk with your doctor to see if Marijean Heath is appropriate for you. Like LAGEVRIO, FDA may also allow for the emergency use of other medicines to treat people with COVID-19. Go to  [email or web address].si for more information. It is your choice to be treated or not to be treated with LAGEVRIO. Should you decide not to take it, it will not change your standard medical care.  What if I am breastfeeding? Breastfeeding is not recommended during treatment with LAGEVRIO and for 4 days after the last dose of LAGEVRIO. If you are breastfeeding or plan to breastfeed, talk to your healthcare provider about your options and specific situation before taking LAGEVRIO.  How do I report side effects with LAGEVRIO? Contact your healthcare provider if you have any side effects that bother you or do not go  away. Report side effects to FDA MedWatch at SmoothHits.hu or call 1-800-FDA-1088 (1- 916-500-9341).  How should I store [place]? ? Store LAGEVRIO capsules at room temperature between 15F to 46F (20C to 25C). ? Keep LAGEVRIO and all medicines out of the reach of children and pets. How can I learn more about COVID-19? ? Ask your healthcare provider. ? Visit SeekRooms.co.uk ? Contact your local or state public health department. ? Call [place] at [phone] (toll free in the [place] ? Visit [email or web address].com  What Is an Emergency Use Authorization (EUA)? The [place] FDA has made [place] available under an emergency access mechanism called an Emergency Use Authorization (EUA) The EUA is supported by a Presenter, broadcasting Health and Human Service (HHS) declaration that circumstances exist to justify emergency use of drugs and biological products during the COVID-19 pandemic. LAGEVRIO for the treatment of mild-to-moderate COVID-19 in adults with positive results of direct SARS-CoV-2 viral testing, who are at high risk for progression to severe COVID-19, including hospitalization or death, and for whom alternative COVID-19 treatment options approved or authorized by FDA are not accessible or clinically appropriate, has not undergone the same type of review as an FDA-approved product. In issuing an EUA under the PYYFR-10 public health emergency, the FDA has determined, among other things, that based on the total amount of scientific evidence available including data from adequate and well-controlled clinical trials, if available, it is reasonable to believe that the product may be effective for diagnosing, treating, or preventing COVID-19, or a serious or life-threatening disease or condition caused by COVID-19; that the known and potential benefits of the product, when used to diagnose, treat, or prevent such disease or condition, outweigh the known and  potential risks of such product; and that there are no adequate, approved, and available alternatives.  All of these criteria must be met to allow for the product to be used in the treatment of patients during the COVID-19 pandemic. The EUA for LAGEVRIO is in effect for the duration of the COVID-19 declaration justifying emergency use of LAGEVRIO, unless terminated or revoked (after which LAGEVRIO may no longer be used under the EUA). For patent information: http://rogers.info/ Copyright  2021-2022 [place] [place], [place] and its affiliates. All rights reserved. [id number] Revised: March 2022

## 2021-09-10 ENCOUNTER — Telehealth: Payer: Self-pay | Admitting: Nurse Practitioner

## 2021-09-11 ENCOUNTER — Encounter: Payer: Self-pay | Admitting: Nurse Practitioner

## 2021-09-11 ENCOUNTER — Ambulatory Visit: Payer: PRIVATE HEALTH INSURANCE | Admitting: Nurse Practitioner

## 2021-09-12 ENCOUNTER — Encounter: Payer: Self-pay | Admitting: Nurse Practitioner

## 2021-09-12 ENCOUNTER — Telehealth: Payer: Self-pay | Admitting: Nurse Practitioner

## 2021-09-12 NOTE — Telephone Encounter (Signed)
-----   Message from Flossie Buffy, NP sent at 09/12/2021  9:25 AM EDT ----- Did not see that. Proceed with dismissal.  Thank you  ----- Message ----- From: Margot Ables Sent: 09/11/2021   4:43 PM EDT To: Flossie Buffy, NP  Previous no show letter in chart noted 1 more no show would be dismissal. Do you want to give 1 more chance or proceed to dismissal?  Thank you,  Drue Dun ----- Message ----- From: Flossie Buffy, NP Sent: 09/11/2021  10:40 AM EDT To: Margot Ables  She has multiple cancelled apointments ( 04/25/20, 10/14/2020, 11/16/20) and 2 no show appointments (05/28/2020 and 09/11/21). Please send a letter to notify about no show and dismissal policy. If another no show, she gets get a dismissal letter.  Thank you

## 2021-09-12 NOTE — Telephone Encounter (Signed)
Dismissal letter mailed

## 2021-09-17 ENCOUNTER — Ambulatory Visit: Payer: PRIVATE HEALTH INSURANCE | Admitting: Nurse Practitioner

## 2021-09-25 DIAGNOSIS — K219 Gastro-esophageal reflux disease without esophagitis: Secondary | ICD-10-CM | POA: Diagnosis not present

## 2021-09-25 DIAGNOSIS — F419 Anxiety disorder, unspecified: Secondary | ICD-10-CM | POA: Diagnosis not present

## 2021-09-25 DIAGNOSIS — D75839 Thrombocytosis, unspecified: Secondary | ICD-10-CM | POA: Diagnosis not present

## 2021-09-25 DIAGNOSIS — I10 Essential (primary) hypertension: Secondary | ICD-10-CM | POA: Diagnosis not present

## 2021-09-26 ENCOUNTER — Telehealth: Payer: Self-pay | Admitting: Nurse Practitioner

## 2021-09-26 NOTE — Telephone Encounter (Signed)
Pt called wondering why she was dismissed. I explained the no shows... She would like to speak with someone about this.

## 2021-10-03 DIAGNOSIS — R7303 Prediabetes: Secondary | ICD-10-CM | POA: Diagnosis not present

## 2021-10-03 DIAGNOSIS — I10 Essential (primary) hypertension: Secondary | ICD-10-CM | POA: Diagnosis not present

## 2021-10-03 DIAGNOSIS — D75839 Thrombocytosis, unspecified: Secondary | ICD-10-CM | POA: Diagnosis not present

## 2021-10-03 DIAGNOSIS — Z23 Encounter for immunization: Secondary | ICD-10-CM | POA: Diagnosis not present

## 2021-10-03 DIAGNOSIS — R7989 Other specified abnormal findings of blood chemistry: Secondary | ICD-10-CM | POA: Diagnosis not present

## 2021-10-07 DIAGNOSIS — Z713 Dietary counseling and surveillance: Secondary | ICD-10-CM | POA: Diagnosis not present

## 2021-10-21 ENCOUNTER — Encounter: Payer: PRIVATE HEALTH INSURANCE | Admitting: Nurse Practitioner

## 2022-01-28 DIAGNOSIS — R7303 Prediabetes: Secondary | ICD-10-CM | POA: Diagnosis not present

## 2022-01-28 DIAGNOSIS — I10 Essential (primary) hypertension: Secondary | ICD-10-CM | POA: Diagnosis not present

## 2022-05-01 DIAGNOSIS — R7303 Prediabetes: Secondary | ICD-10-CM | POA: Diagnosis not present

## 2022-06-13 DIAGNOSIS — Z23 Encounter for immunization: Secondary | ICD-10-CM | POA: Diagnosis not present

## 2022-06-13 DIAGNOSIS — Z1331 Encounter for screening for depression: Secondary | ICD-10-CM | POA: Diagnosis not present

## 2022-06-13 DIAGNOSIS — Z0000 Encounter for general adult medical examination without abnormal findings: Secondary | ICD-10-CM | POA: Diagnosis not present

## 2022-06-13 DIAGNOSIS — I10 Essential (primary) hypertension: Secondary | ICD-10-CM | POA: Diagnosis not present

## 2022-06-13 DIAGNOSIS — Z1339 Encounter for screening examination for other mental health and behavioral disorders: Secondary | ICD-10-CM | POA: Diagnosis not present

## 2022-06-30 ENCOUNTER — Other Ambulatory Visit: Payer: Self-pay | Admitting: Family Medicine

## 2022-06-30 DIAGNOSIS — Z1231 Encounter for screening mammogram for malignant neoplasm of breast: Secondary | ICD-10-CM

## 2022-07-04 ENCOUNTER — Inpatient Hospital Stay: Admission: RE | Admit: 2022-07-04 | Payer: PRIVATE HEALTH INSURANCE | Source: Ambulatory Visit

## 2022-07-23 ENCOUNTER — Encounter (INDEPENDENT_AMBULATORY_CARE_PROVIDER_SITE_OTHER): Payer: Self-pay

## 2022-08-08 NOTE — Telephone Encounter (Signed)
Na

## 2022-09-23 DIAGNOSIS — Z1231 Encounter for screening mammogram for malignant neoplasm of breast: Secondary | ICD-10-CM | POA: Diagnosis not present

## 2022-09-25 DIAGNOSIS — Z6841 Body Mass Index (BMI) 40.0 and over, adult: Secondary | ICD-10-CM | POA: Diagnosis not present

## 2022-09-25 DIAGNOSIS — Z23 Encounter for immunization: Secondary | ICD-10-CM | POA: Diagnosis not present

## 2022-09-25 DIAGNOSIS — J029 Acute pharyngitis, unspecified: Secondary | ICD-10-CM | POA: Diagnosis not present

## 2022-09-25 DIAGNOSIS — R519 Headache, unspecified: Secondary | ICD-10-CM | POA: Diagnosis not present

## 2022-11-12 ENCOUNTER — Other Ambulatory Visit: Payer: Self-pay | Admitting: Nurse Practitioner

## 2022-11-12 DIAGNOSIS — I10 Essential (primary) hypertension: Secondary | ICD-10-CM

## 2022-11-13 ENCOUNTER — Telehealth: Payer: Self-pay

## 2022-11-13 ENCOUNTER — Other Ambulatory Visit (HOSPITAL_COMMUNITY): Payer: Self-pay

## 2022-11-13 MED ORDER — OZEMPIC (2 MG/DOSE) 8 MG/3ML ~~LOC~~ SOPN
2.0000 mg | PEN_INJECTOR | SUBCUTANEOUS | 11 refills | Status: DC
  Filled 2022-11-13 (×2): qty 3, 28d supply, fill #0
  Filled 2022-12-05: qty 3, 28d supply, fill #1
  Filled 2023-01-19 – 2023-02-04 (×2): qty 3, 28d supply, fill #2

## 2022-11-13 NOTE — Telephone Encounter (Signed)
Patient was called to inquire about her current weight and BMI. In 02-07-21 BMI was 51.03 and her weight was 279. Patient has been on Phentermine and Ozempic to help with weight loss. Patient reports her current weight is 247 which brings her BMI down to 45.2. Patient was told to keep her appointment on 11/14/22 in Pre-Visit.

## 2022-11-14 ENCOUNTER — Other Ambulatory Visit (HOSPITAL_COMMUNITY): Payer: Self-pay

## 2022-11-14 ENCOUNTER — Ambulatory Visit (AMBULATORY_SURGERY_CENTER): Payer: PRIVATE HEALTH INSURANCE | Admitting: *Deleted

## 2022-11-14 VITALS — Ht 62.0 in | Wt 248.0 lb

## 2022-11-14 DIAGNOSIS — Z1211 Encounter for screening for malignant neoplasm of colon: Secondary | ICD-10-CM

## 2022-11-14 MED ORDER — NA SULFATE-K SULFATE-MG SULF 17.5-3.13-1.6 GM/177ML PO SOLN: 1.0000 | Freq: Once | ORAL | 0 refills | Status: AC

## 2022-11-14 NOTE — Progress Notes (Signed)
No egg or soy allergy known to patient  No issues known to pt with past sedation with any surgeries or procedures Patient denies ever being told they had issues or difficulty with intubation  No FH of Malignant Hyperthermia Pt is on diet pills Pt has med but has not taken in a month. Instructed pt not to take med till after procedure. Pt states she will Pt is not on  home 02  Pt is not on blood thinners  Pt denies issues with constipation  Pt encouraged to use to use Singlecare or Goodrx to reduce cost  Pt denise any cardiac issues  In Person  Coupon given  Patient's chart reviewed by Osvaldo Angst CNRA prior to previsit and patient appropriate for the [place].  Previsit completed and red dot placed by patient's name on their procedure day (on provider's schedule).  Marland Kitchen

## 2022-11-15 ENCOUNTER — Other Ambulatory Visit: Payer: Self-pay | Admitting: Internal Medicine

## 2022-11-15 DIAGNOSIS — Z1211 Encounter for screening for malignant neoplasm of colon: Secondary | ICD-10-CM

## 2022-11-24 ENCOUNTER — Telehealth: Payer: Self-pay | Admitting: Internal Medicine

## 2022-11-24 NOTE — Telephone Encounter (Signed)
Patient rescheduled for procedure. Please update prep instructions-dm

## 2022-11-24 NOTE — Telephone Encounter (Signed)
LM on pts VM notifying her that new prep instructions were sent to her my chart.  Advised her to call back if she has any further questions.

## 2022-11-25 ENCOUNTER — Encounter: Payer: PRIVATE HEALTH INSURANCE | Admitting: Internal Medicine

## 2022-11-28 ENCOUNTER — Encounter: Payer: PRIVATE HEALTH INSURANCE | Admitting: Internal Medicine

## 2022-12-05 ENCOUNTER — Other Ambulatory Visit (HOSPITAL_COMMUNITY): Payer: Self-pay

## 2022-12-09 ENCOUNTER — Other Ambulatory Visit (HOSPITAL_COMMUNITY): Payer: Self-pay

## 2023-01-07 ENCOUNTER — Other Ambulatory Visit: Payer: Self-pay | Admitting: Nurse Practitioner

## 2023-01-07 DIAGNOSIS — I10 Essential (primary) hypertension: Secondary | ICD-10-CM

## 2023-01-19 ENCOUNTER — Other Ambulatory Visit (HOSPITAL_COMMUNITY): Payer: Self-pay

## 2023-01-20 ENCOUNTER — Telehealth: Payer: Self-pay | Admitting: Internal Medicine

## 2023-01-20 NOTE — Telephone Encounter (Signed)
This patient had a colonoscopy scheduled for 2/8; however, she called today to cancel as she said she had an emergency out of town.  She will call back later to reschedule.

## 2023-01-22 ENCOUNTER — Encounter: Payer: PRIVATE HEALTH INSURANCE | Admitting: Internal Medicine

## 2023-02-04 ENCOUNTER — Other Ambulatory Visit (HOSPITAL_COMMUNITY): Payer: Self-pay

## 2023-02-24 ENCOUNTER — Other Ambulatory Visit (HOSPITAL_COMMUNITY): Payer: Self-pay

## 2023-02-24 DIAGNOSIS — R7303 Prediabetes: Secondary | ICD-10-CM | POA: Diagnosis not present

## 2023-02-24 DIAGNOSIS — N951 Menopausal and female climacteric states: Secondary | ICD-10-CM | POA: Diagnosis not present

## 2023-02-24 DIAGNOSIS — I10 Essential (primary) hypertension: Secondary | ICD-10-CM | POA: Diagnosis not present

## 2023-02-26 ENCOUNTER — Other Ambulatory Visit (HOSPITAL_COMMUNITY): Payer: Self-pay

## 2023-03-17 DIAGNOSIS — L910 Hypertrophic scar: Secondary | ICD-10-CM | POA: Diagnosis not present

## 2023-03-17 DIAGNOSIS — L810 Postinflammatory hyperpigmentation: Secondary | ICD-10-CM | POA: Diagnosis not present

## 2023-03-30 DIAGNOSIS — L910 Hypertrophic scar: Secondary | ICD-10-CM | POA: Diagnosis not present

## 2023-04-13 DIAGNOSIS — L910 Hypertrophic scar: Secondary | ICD-10-CM | POA: Diagnosis not present

## 2023-06-08 DIAGNOSIS — I10 Essential (primary) hypertension: Secondary | ICD-10-CM | POA: Diagnosis not present

## 2023-06-08 DIAGNOSIS — R7303 Prediabetes: Secondary | ICD-10-CM | POA: Diagnosis not present

## 2023-06-11 ENCOUNTER — Other Ambulatory Visit (HOSPITAL_COMMUNITY): Payer: Self-pay

## 2023-06-11 DIAGNOSIS — Z0000 Encounter for general adult medical examination without abnormal findings: Secondary | ICD-10-CM | POA: Diagnosis not present

## 2023-06-11 DIAGNOSIS — R7303 Prediabetes: Secondary | ICD-10-CM | POA: Diagnosis not present

## 2023-06-11 DIAGNOSIS — Z1331 Encounter for screening for depression: Secondary | ICD-10-CM | POA: Diagnosis not present

## 2023-06-11 DIAGNOSIS — Z1339 Encounter for screening examination for other mental health and behavioral disorders: Secondary | ICD-10-CM | POA: Diagnosis not present

## 2023-06-11 MED ORDER — OZEMPIC (2 MG/DOSE) 8 MG/3ML ~~LOC~~ SOPN
2.0000 mg | PEN_INJECTOR | SUBCUTANEOUS | 11 refills | Status: AC
  Filled 2023-06-11 – 2023-07-21 (×4): qty 3, 28d supply, fill #0

## 2023-06-22 ENCOUNTER — Other Ambulatory Visit (HOSPITAL_COMMUNITY): Payer: Self-pay

## 2023-07-06 ENCOUNTER — Other Ambulatory Visit (HOSPITAL_COMMUNITY): Payer: Self-pay

## 2023-07-07 ENCOUNTER — Other Ambulatory Visit (HOSPITAL_COMMUNITY): Payer: Self-pay

## 2023-07-09 ENCOUNTER — Other Ambulatory Visit (HOSPITAL_COMMUNITY): Payer: Self-pay

## 2023-07-13 ENCOUNTER — Other Ambulatory Visit (HOSPITAL_COMMUNITY): Payer: Self-pay

## 2023-07-14 DIAGNOSIS — Z1211 Encounter for screening for malignant neoplasm of colon: Secondary | ICD-10-CM | POA: Diagnosis not present

## 2023-07-14 DIAGNOSIS — Z1212 Encounter for screening for malignant neoplasm of rectum: Secondary | ICD-10-CM | POA: Diagnosis not present

## 2023-07-16 ENCOUNTER — Other Ambulatory Visit (HOSPITAL_COMMUNITY): Payer: Self-pay

## 2023-07-17 ENCOUNTER — Other Ambulatory Visit (HOSPITAL_COMMUNITY): Payer: Self-pay

## 2023-07-17 ENCOUNTER — Other Ambulatory Visit: Payer: Self-pay

## 2023-07-20 ENCOUNTER — Other Ambulatory Visit (HOSPITAL_COMMUNITY): Payer: Self-pay

## 2023-07-21 ENCOUNTER — Other Ambulatory Visit (HOSPITAL_COMMUNITY): Payer: Self-pay

## 2023-09-02 DIAGNOSIS — Z01419 Encounter for gynecological examination (general) (routine) without abnormal findings: Secondary | ICD-10-CM | POA: Diagnosis not present

## 2023-09-02 DIAGNOSIS — Z130 Encounter for screening for diseases of the blood and blood-forming organs and certain disorders involving the immune mechanism: Secondary | ICD-10-CM | POA: Diagnosis not present

## 2023-09-02 DIAGNOSIS — Z1151 Encounter for screening for human papillomavirus (HPV): Secondary | ICD-10-CM | POA: Diagnosis not present

## 2023-09-02 DIAGNOSIS — Z124 Encounter for screening for malignant neoplasm of cervix: Secondary | ICD-10-CM | POA: Diagnosis not present

## 2023-09-02 DIAGNOSIS — R6882 Decreased libido: Secondary | ICD-10-CM | POA: Diagnosis not present

## 2023-10-01 DIAGNOSIS — I10 Essential (primary) hypertension: Secondary | ICD-10-CM | POA: Diagnosis not present

## 2023-10-01 DIAGNOSIS — R7303 Prediabetes: Secondary | ICD-10-CM | POA: Diagnosis not present

## 2023-10-01 DIAGNOSIS — Z23 Encounter for immunization: Secondary | ICD-10-CM | POA: Diagnosis not present

## 2023-12-03 DIAGNOSIS — Z1231 Encounter for screening mammogram for malignant neoplasm of breast: Secondary | ICD-10-CM | POA: Diagnosis not present

## 2023-12-03 DIAGNOSIS — R92323 Mammographic fibroglandular density, bilateral breasts: Secondary | ICD-10-CM | POA: Diagnosis not present

## 2024-02-17 DIAGNOSIS — I10 Essential (primary) hypertension: Secondary | ICD-10-CM | POA: Diagnosis not present

## 2024-02-17 DIAGNOSIS — R7303 Prediabetes: Secondary | ICD-10-CM | POA: Diagnosis not present

## 2024-06-23 DIAGNOSIS — I10 Essential (primary) hypertension: Secondary | ICD-10-CM | POA: Diagnosis not present

## 2024-06-23 DIAGNOSIS — R7303 Prediabetes: Secondary | ICD-10-CM | POA: Diagnosis not present

## 2024-06-29 DIAGNOSIS — Z0000 Encounter for general adult medical examination without abnormal findings: Secondary | ICD-10-CM | POA: Diagnosis not present

## 2024-06-29 DIAGNOSIS — Z23 Encounter for immunization: Secondary | ICD-10-CM | POA: Diagnosis not present

## 2024-06-29 DIAGNOSIS — M259 Joint disorder, unspecified: Secondary | ICD-10-CM | POA: Diagnosis not present

## 2024-06-29 DIAGNOSIS — E559 Vitamin D deficiency, unspecified: Secondary | ICD-10-CM | POA: Diagnosis not present

## 2024-06-29 DIAGNOSIS — D649 Anemia, unspecified: Secondary | ICD-10-CM | POA: Diagnosis not present

## 2024-06-29 DIAGNOSIS — I10 Essential (primary) hypertension: Secondary | ICD-10-CM | POA: Diagnosis not present

## 2024-06-29 DIAGNOSIS — Z1339 Encounter for screening examination for other mental health and behavioral disorders: Secondary | ICD-10-CM | POA: Diagnosis not present

## 2024-06-29 DIAGNOSIS — R7303 Prediabetes: Secondary | ICD-10-CM | POA: Diagnosis not present

## 2024-06-29 DIAGNOSIS — Z1331 Encounter for screening for depression: Secondary | ICD-10-CM | POA: Diagnosis not present
